# Patient Record
Sex: Male | Born: 1950 | Race: White | Hispanic: No | Marital: Married | Smoking: Former smoker
Health system: Southern US, Community
[De-identification: ages and names within clinical notes are randomized; demographics above are authoritative.]

## PROBLEM LIST (undated history)

## (undated) DIAGNOSIS — Z923 Personal history of irradiation: Secondary | ICD-10-CM

## (undated) DIAGNOSIS — C801 Malignant (primary) neoplasm, unspecified: Secondary | ICD-10-CM

## (undated) DIAGNOSIS — T7840XA Allergy, unspecified, initial encounter: Secondary | ICD-10-CM

## (undated) DIAGNOSIS — Z9221 Personal history of antineoplastic chemotherapy: Secondary | ICD-10-CM

## (undated) DIAGNOSIS — I10 Essential (primary) hypertension: Secondary | ICD-10-CM

## (undated) DIAGNOSIS — R42 Dizziness and giddiness: Secondary | ICD-10-CM

## (undated) DIAGNOSIS — E7800 Pure hypercholesterolemia, unspecified: Secondary | ICD-10-CM

## (undated) HISTORY — DX: Malignant (primary) neoplasm, unspecified: C80.1

## (undated) HISTORY — PX: CERVICAL SPINE SURGERY: SHX589

## (undated) HISTORY — DX: Personal history of irradiation: Z92.3

## (undated) HISTORY — DX: Allergy, unspecified, initial encounter: T78.40XA

## (undated) HISTORY — DX: Personal history of antineoplastic chemotherapy: Z92.21

## (undated) HISTORY — DX: Pure hypercholesterolemia, unspecified: E78.00

## (undated) HISTORY — DX: Essential (primary) hypertension: I10

## (undated) HISTORY — DX: Dizziness and giddiness: R42

---

## 1998-07-02 ENCOUNTER — Ambulatory Visit (HOSPITAL_COMMUNITY): Admission: RE | Admit: 1998-07-02 | Discharge: 1998-07-02 | Payer: Self-pay | Admitting: Gastroenterology

## 2001-02-15 ENCOUNTER — Observation Stay (HOSPITAL_COMMUNITY): Admission: RE | Admit: 2001-02-15 | Discharge: 2001-02-16 | Payer: Self-pay | Admitting: Neurosurgery

## 2003-10-03 ENCOUNTER — Encounter: Admission: RE | Admit: 2003-10-03 | Discharge: 2003-10-03 | Payer: Self-pay | Admitting: Family Medicine

## 2003-10-20 ENCOUNTER — Encounter: Admission: RE | Admit: 2003-10-20 | Discharge: 2003-11-06 | Payer: Self-pay | Admitting: Family Medicine

## 2003-11-04 ENCOUNTER — Ambulatory Visit (HOSPITAL_COMMUNITY): Admission: RE | Admit: 2003-11-04 | Discharge: 2003-11-04 | Payer: Self-pay | Admitting: Gastroenterology

## 2004-02-26 ENCOUNTER — Encounter: Admission: RE | Admit: 2004-02-26 | Discharge: 2004-02-26 | Payer: Self-pay | Admitting: Family Medicine

## 2004-03-02 ENCOUNTER — Ambulatory Visit: Payer: Self-pay | Admitting: Family Medicine

## 2004-09-07 ENCOUNTER — Ambulatory Visit: Payer: Self-pay | Admitting: Family Medicine

## 2005-03-10 ENCOUNTER — Ambulatory Visit: Payer: Self-pay | Admitting: Family Medicine

## 2005-04-05 ENCOUNTER — Ambulatory Visit: Payer: Self-pay | Admitting: Internal Medicine

## 2005-09-06 ENCOUNTER — Ambulatory Visit: Payer: Self-pay | Admitting: Family Medicine

## 2005-09-27 ENCOUNTER — Ambulatory Visit: Payer: Self-pay | Admitting: Family Medicine

## 2005-11-22 ENCOUNTER — Ambulatory Visit: Payer: Self-pay | Admitting: Family Medicine

## 2006-05-02 ENCOUNTER — Ambulatory Visit: Payer: Self-pay | Admitting: Family Medicine

## 2006-05-02 LAB — CONVERTED CEMR LAB
ALT: 36 units/L (ref 0–40)
Calcium: 9.9 mg/dL (ref 8.4–10.5)
Chloride: 106 meq/L (ref 96–112)
GFR calc Af Amer: 99 mL/min
Glucose, Bld: 96 mg/dL (ref 70–99)
Potassium: 3.9 meq/L (ref 3.5–5.1)

## 2006-05-23 ENCOUNTER — Ambulatory Visit: Payer: Self-pay | Admitting: Family Medicine

## 2006-06-06 ENCOUNTER — Encounter: Admission: RE | Admit: 2006-06-06 | Discharge: 2006-06-06 | Payer: Self-pay | Admitting: Family Medicine

## 2006-06-13 ENCOUNTER — Encounter: Admission: RE | Admit: 2006-06-13 | Discharge: 2006-06-13 | Payer: Self-pay | Admitting: Family Medicine

## 2006-07-11 DIAGNOSIS — C801 Malignant (primary) neoplasm, unspecified: Secondary | ICD-10-CM

## 2006-07-11 HISTORY — DX: Malignant (primary) neoplasm, unspecified: C80.1

## 2006-07-18 ENCOUNTER — Encounter (INDEPENDENT_AMBULATORY_CARE_PROVIDER_SITE_OTHER): Payer: Self-pay | Admitting: Otolaryngology

## 2006-07-18 ENCOUNTER — Ambulatory Visit (HOSPITAL_COMMUNITY): Admission: RE | Admit: 2006-07-18 | Discharge: 2006-07-18 | Payer: Self-pay | Admitting: Otolaryngology

## 2006-07-18 ENCOUNTER — Encounter: Admission: RE | Admit: 2006-07-18 | Discharge: 2006-07-18 | Payer: Self-pay | Admitting: Otolaryngology

## 2006-07-18 ENCOUNTER — Encounter (INDEPENDENT_AMBULATORY_CARE_PROVIDER_SITE_OTHER): Payer: Self-pay | Admitting: Specialist

## 2006-07-31 ENCOUNTER — Ambulatory Visit (HOSPITAL_COMMUNITY): Admission: RE | Admit: 2006-07-31 | Discharge: 2006-07-31 | Payer: Self-pay | Admitting: Otolaryngology

## 2006-08-08 ENCOUNTER — Ambulatory Visit: Admission: RE | Admit: 2006-08-08 | Discharge: 2006-10-27 | Payer: Self-pay | Admitting: Radiation Oncology

## 2006-08-11 ENCOUNTER — Ambulatory Visit: Payer: Self-pay | Admitting: Oncology

## 2006-08-22 ENCOUNTER — Ambulatory Visit: Payer: Self-pay | Admitting: Family Medicine

## 2006-08-22 ENCOUNTER — Encounter: Payer: Self-pay | Admitting: Family Medicine

## 2006-08-22 LAB — COMPREHENSIVE METABOLIC PANEL
AST: 24 U/L (ref 0–37)
Albumin: 4.4 g/dL (ref 3.5–5.2)
Alkaline Phosphatase: 75 U/L (ref 39–117)
Potassium: 4.3 mEq/L (ref 3.5–5.3)
Sodium: 135 mEq/L (ref 135–145)
Total Protein: 6.7 g/dL (ref 6.0–8.3)

## 2006-08-22 LAB — CBC WITH DIFFERENTIAL/PLATELET
BASO%: 0.3 % (ref 0.0–2.0)
EOS%: 7.8 % — ABNORMAL HIGH (ref 0.0–7.0)
MCH: 34 pg — ABNORMAL HIGH (ref 28.0–33.4)
MCV: 95.8 fL (ref 81.6–98.0)
MONO%: 7.8 % (ref 0.0–13.0)
NEUT#: 6.6 10*3/uL — ABNORMAL HIGH (ref 1.5–6.5)
RBC: 4.58 10*6/uL (ref 4.20–5.71)
RDW: 12.8 % (ref 11.2–14.6)

## 2006-08-24 ENCOUNTER — Ambulatory Visit: Payer: Self-pay | Admitting: Dentistry

## 2006-08-24 ENCOUNTER — Encounter: Admission: EM | Admit: 2006-08-24 | Discharge: 2006-08-24 | Payer: Self-pay | Admitting: Dentistry

## 2006-08-27 LAB — CONVERTED CEMR LAB
ALT: 37 units/L (ref 0–40)
Alkaline Phosphatase: 65 units/L (ref 39–117)
BUN: 10 mg/dL (ref 6–23)
CO2: 28 meq/L (ref 19–32)
Calcium: 9.6 mg/dL (ref 8.4–10.5)
GFR calc Af Amer: 128 mL/min
GFR calc non Af Amer: 106 mL/min

## 2006-09-08 ENCOUNTER — Encounter: Payer: Self-pay | Admitting: Family Medicine

## 2006-09-08 DIAGNOSIS — E119 Type 2 diabetes mellitus without complications: Secondary | ICD-10-CM | POA: Insufficient documentation

## 2006-09-08 DIAGNOSIS — E785 Hyperlipidemia, unspecified: Secondary | ICD-10-CM

## 2006-09-08 DIAGNOSIS — I10 Essential (primary) hypertension: Secondary | ICD-10-CM | POA: Insufficient documentation

## 2006-09-08 DIAGNOSIS — D126 Benign neoplasm of colon, unspecified: Secondary | ICD-10-CM

## 2006-09-08 DIAGNOSIS — J309 Allergic rhinitis, unspecified: Secondary | ICD-10-CM | POA: Insufficient documentation

## 2006-09-11 ENCOUNTER — Ambulatory Visit: Payer: Self-pay | Admitting: Dentistry

## 2006-09-21 ENCOUNTER — Encounter: Payer: Self-pay | Admitting: Family Medicine

## 2006-09-21 LAB — CBC WITH DIFFERENTIAL/PLATELET
BASO%: 0.7 % (ref 0.0–2.0)
EOS%: 8.2 % — ABNORMAL HIGH (ref 0.0–7.0)
HCT: 42.4 % (ref 38.7–49.9)
LYMPH%: 23.5 % (ref 14.0–48.0)
MCH: 33.7 pg — ABNORMAL HIGH (ref 28.0–33.4)
MCHC: 35.4 g/dL (ref 32.0–35.9)
MCV: 95.2 fL (ref 81.6–98.0)
NEUT%: 58.7 % (ref 40.0–75.0)
Platelets: 274 10*3/uL (ref 145–400)

## 2006-09-21 LAB — COMPREHENSIVE METABOLIC PANEL
ALT: 40 U/L (ref 0–53)
CO2: 25 mEq/L (ref 19–32)
Creatinine, Ser: 0.8 mg/dL (ref 0.40–1.50)
Total Bilirubin: 0.7 mg/dL (ref 0.3–1.2)

## 2006-09-27 ENCOUNTER — Encounter: Payer: Self-pay | Admitting: Family Medicine

## 2006-09-27 DIAGNOSIS — D0000 Carcinoma in situ of oral cavity, unspecified site: Secondary | ICD-10-CM

## 2006-09-27 DIAGNOSIS — D0008 Carcinoma in situ of pharynx: Secondary | ICD-10-CM

## 2006-09-27 DIAGNOSIS — D0001 Carcinoma in situ of labial mucosa and vermilion border: Secondary | ICD-10-CM

## 2006-09-28 ENCOUNTER — Encounter: Payer: Self-pay | Admitting: Family Medicine

## 2006-09-28 ENCOUNTER — Ambulatory Visit: Payer: Self-pay | Admitting: Oncology

## 2006-10-05 ENCOUNTER — Encounter: Payer: Self-pay | Admitting: Internal Medicine

## 2006-10-05 LAB — CBC WITH DIFFERENTIAL/PLATELET
BASO%: 0.7 % (ref 0.0–2.0)
Basophils Absolute: 0.1 10*3/uL (ref 0.0–0.1)
Eosinophils Absolute: 0.7 10*3/uL — ABNORMAL HIGH (ref 0.0–0.5)
HCT: UNDETERMINED % (ref 38.7–49.9)
LYMPH%: 6.7 % — ABNORMAL LOW (ref 14.0–48.0)
MCHC: UNDETERMINED g/dL (ref 32.0–35.9)
MONO#: 1.5 10*3/uL — ABNORMAL HIGH (ref 0.1–0.9)
NEUT%: 76.3 % — ABNORMAL HIGH (ref 40.0–75.0)
Platelets: 262 10*3/uL (ref 145–400)
WBC: 13.4 10*3/uL — ABNORMAL HIGH (ref 4.0–10.0)

## 2006-10-05 LAB — COMPREHENSIVE METABOLIC PANEL
AST: 24 U/L (ref 0–37)
Alkaline Phosphatase: 70 U/L (ref 39–117)
BUN: 10 mg/dL (ref 6–23)
Creatinine, Ser: 0.71 mg/dL (ref 0.40–1.50)
Potassium: 4.2 mEq/L (ref 3.5–5.3)
Total Bilirubin: 0.9 mg/dL (ref 0.3–1.2)

## 2006-10-12 ENCOUNTER — Encounter: Payer: Self-pay | Admitting: Family Medicine

## 2006-10-12 LAB — CBC WITH DIFFERENTIAL/PLATELET
Basophils Absolute: 0 10*3/uL (ref 0.0–0.1)
EOS%: 3 % (ref 0.0–7.0)
Eosinophils Absolute: 0.3 10*3/uL (ref 0.0–0.5)
HCT: 42.2 % (ref 38.7–49.9)
HGB: 15.4 g/dL (ref 13.0–17.1)
MCH: 34.4 pg — ABNORMAL HIGH (ref 28.0–33.4)
MCV: 94.3 fL (ref 81.6–98.0)
MONO%: 10.4 % (ref 0.0–13.0)
NEUT#: 9 10*3/uL — ABNORMAL HIGH (ref 1.5–6.5)
NEUT%: 82.5 % — ABNORMAL HIGH (ref 40.0–75.0)
Platelets: 269 10*3/uL (ref 145–400)

## 2006-10-12 LAB — COMPREHENSIVE METABOLIC PANEL
AST: 22 U/L (ref 0–37)
Albumin: 3.6 g/dL (ref 3.5–5.2)
Alkaline Phosphatase: 78 U/L (ref 39–117)
BUN: 15 mg/dL (ref 6–23)
Calcium: 9.3 mg/dL (ref 8.4–10.5)
Chloride: 99 mEq/L (ref 96–112)
Creatinine, Ser: 0.86 mg/dL (ref 0.40–1.50)
Glucose, Bld: 117 mg/dL — ABNORMAL HIGH (ref 70–99)

## 2006-10-18 ENCOUNTER — Ambulatory Visit (HOSPITAL_COMMUNITY): Admission: RE | Admit: 2006-10-18 | Discharge: 2006-10-18 | Payer: Self-pay | Admitting: Radiation Oncology

## 2006-10-19 LAB — COMPREHENSIVE METABOLIC PANEL
ALT: 28 U/L (ref 0–53)
AST: 25 U/L (ref 0–37)
Albumin: 3.7 g/dL (ref 3.5–5.2)
CO2: 26 mEq/L (ref 19–32)
Calcium: 9.4 mg/dL (ref 8.4–10.5)
Chloride: 98 mEq/L (ref 96–112)
Creatinine, Ser: 0.97 mg/dL (ref 0.40–1.50)
Potassium: 4.2 mEq/L (ref 3.5–5.3)

## 2006-10-19 LAB — CBC WITH DIFFERENTIAL/PLATELET
BASO%: 0.8 % (ref 0.0–2.0)
EOS%: 1.2 % (ref 0.0–7.0)
HCT: UNDETERMINED % (ref 38.7–49.9)
MCH: UNDETERMINED pg (ref 28.0–33.4)
MCHC: UNDETERMINED g/dL (ref 32.0–35.9)
MONO#: 1.3 10*3/uL — ABNORMAL HIGH (ref 0.1–0.9)
RBC: UNDETERMINED 10*6/uL (ref 4.20–5.71)
RDW: 10.4 % — ABNORMAL LOW (ref 11.2–14.6)
WBC: 11.9 10*3/uL — ABNORMAL HIGH (ref 4.0–10.0)
lymph#: 0.4 10*3/uL — ABNORMAL LOW (ref 0.9–3.3)

## 2006-10-26 ENCOUNTER — Encounter: Payer: Self-pay | Admitting: Family Medicine

## 2006-10-26 LAB — CBC WITH DIFFERENTIAL/PLATELET
Eosinophils Absolute: 0.1 10*3/uL (ref 0.0–0.5)
HCT: 34.8 % — ABNORMAL LOW (ref 38.7–49.9)
LYMPH%: 3 % — ABNORMAL LOW (ref 14.0–48.0)
MONO#: 0.6 10*3/uL (ref 0.1–0.9)
NEUT#: 5.3 10*3/uL (ref 1.5–6.5)
Platelets: 184 10*3/uL (ref 145–400)
RBC: 3.69 10*6/uL — ABNORMAL LOW (ref 4.20–5.71)
WBC: 6.5 10*3/uL (ref 4.0–10.0)

## 2006-10-26 LAB — COMPREHENSIVE METABOLIC PANEL
Albumin: 3.4 g/dL — ABNORMAL LOW (ref 3.5–5.2)
CO2: 29 mEq/L (ref 19–32)
Glucose, Bld: 166 mg/dL — ABNORMAL HIGH (ref 70–99)
Sodium: 132 mEq/L — ABNORMAL LOW (ref 135–145)
Total Bilirubin: 0.8 mg/dL (ref 0.3–1.2)
Total Protein: 6.6 g/dL (ref 6.0–8.3)

## 2006-10-27 ENCOUNTER — Ambulatory Visit: Admission: RE | Admit: 2006-10-27 | Discharge: 2006-11-20 | Payer: Self-pay | Admitting: Radiation Oncology

## 2006-11-02 LAB — COMPREHENSIVE METABOLIC PANEL
ALT: 20 U/L (ref 0–53)
AST: 21 U/L (ref 0–37)
Albumin: 3.4 g/dL — ABNORMAL LOW (ref 3.5–5.2)
Alkaline Phosphatase: 65 U/L (ref 39–117)
Glucose, Bld: 138 mg/dL — ABNORMAL HIGH (ref 70–99)
Potassium: 4.2 mEq/L (ref 3.5–5.3)
Sodium: 132 mEq/L — ABNORMAL LOW (ref 135–145)
Total Protein: 6.7 g/dL (ref 6.0–8.3)

## 2006-11-02 LAB — CBC WITH DIFFERENTIAL/PLATELET
EOS%: 1.8 % (ref 0.0–7.0)
MCH: UNDETERMINED pg (ref 28.0–33.4)
MCHC: UNDETERMINED g/dL (ref 32.0–35.9)
MCV: UNDETERMINED fL (ref 81.6–98.0)
MONO%: 11.2 % (ref 0.0–13.0)
RBC: UNDETERMINED 10*6/uL (ref 4.20–5.71)
RDW: UNDETERMINED % (ref 11.2–14.6)

## 2006-11-08 ENCOUNTER — Encounter: Payer: Self-pay | Admitting: Family Medicine

## 2006-11-14 ENCOUNTER — Ambulatory Visit: Payer: Self-pay | Admitting: Oncology

## 2006-11-16 ENCOUNTER — Encounter: Payer: Self-pay | Admitting: Family Medicine

## 2006-11-16 LAB — CBC WITH DIFFERENTIAL/PLATELET
BASO%: 0.6 % (ref 0.0–2.0)
Eosinophils Absolute: 0 10*3/uL (ref 0.0–0.5)
MCHC: 36 g/dL — ABNORMAL HIGH (ref 32.0–35.9)
MCV: 97.9 fL (ref 81.6–98.0)
MONO#: 0.6 10*3/uL (ref 0.1–0.9)
MONO%: 24.2 % — ABNORMAL HIGH (ref 0.0–13.0)
NEUT#: 1.8 10*3/uL (ref 1.5–6.5)
RBC: 3.12 10*6/uL — ABNORMAL LOW (ref 4.20–5.71)
RDW: 16.5 % — ABNORMAL HIGH (ref 11.2–14.6)
WBC: 2.6 10*3/uL — ABNORMAL LOW (ref 4.0–10.0)

## 2006-11-16 LAB — COMPREHENSIVE METABOLIC PANEL
ALT: 13 U/L (ref 0–53)
Albumin: 3.3 g/dL — ABNORMAL LOW (ref 3.5–5.2)
Alkaline Phosphatase: 74 U/L (ref 39–117)
Glucose, Bld: 167 mg/dL — ABNORMAL HIGH (ref 70–99)
Potassium: 4.5 mEq/L (ref 3.5–5.3)
Sodium: 135 mEq/L (ref 135–145)
Total Protein: 6.2 g/dL (ref 6.0–8.3)

## 2006-12-26 ENCOUNTER — Ambulatory Visit: Payer: Self-pay | Admitting: Dentistry

## 2007-01-02 ENCOUNTER — Encounter: Payer: Self-pay | Admitting: Family Medicine

## 2007-01-02 ENCOUNTER — Ambulatory Visit: Payer: Self-pay | Admitting: Oncology

## 2007-01-02 LAB — CBC WITH DIFFERENTIAL/PLATELET
BASO%: 0.4 % (ref 0.0–2.0)
Eosinophils Absolute: 0.6 10*3/uL — ABNORMAL HIGH (ref 0.0–0.5)
HCT: 35.5 % — ABNORMAL LOW (ref 38.7–49.9)
LYMPH%: 10.1 % — ABNORMAL LOW (ref 14.0–48.0)
MCHC: 36.5 g/dL — ABNORMAL HIGH (ref 32.0–35.9)
MONO#: 0.6 10*3/uL (ref 0.1–0.9)
NEUT#: 3.9 10*3/uL (ref 1.5–6.5)
NEUT%: 67.8 % (ref 40.0–75.0)
Platelets: 217 10*3/uL (ref 145–400)
WBC: 5.8 10*3/uL (ref 4.0–10.0)
lymph#: 0.6 10*3/uL — ABNORMAL LOW (ref 0.9–3.3)

## 2007-01-02 LAB — COMPREHENSIVE METABOLIC PANEL
ALT: 11 U/L (ref 0–53)
CO2: 25 mEq/L (ref 19–32)
Calcium: 9.6 mg/dL (ref 8.4–10.5)
Chloride: 105 mEq/L (ref 96–112)
Creatinine, Ser: 0.75 mg/dL (ref 0.40–1.50)
Glucose, Bld: 117 mg/dL — ABNORMAL HIGH (ref 70–99)
Sodium: 140 mEq/L (ref 135–145)
Total Bilirubin: 0.4 mg/dL (ref 0.3–1.2)
Total Protein: 6.7 g/dL (ref 6.0–8.3)

## 2007-01-03 ENCOUNTER — Ambulatory Visit (HOSPITAL_COMMUNITY): Admission: RE | Admit: 2007-01-03 | Discharge: 2007-01-03 | Payer: Self-pay | Admitting: Radiation Oncology

## 2007-01-09 ENCOUNTER — Ambulatory Visit: Admission: RE | Admit: 2007-01-09 | Discharge: 2007-02-28 | Payer: Self-pay | Admitting: Radiation Oncology

## 2007-02-07 ENCOUNTER — Encounter: Payer: Self-pay | Admitting: Family Medicine

## 2007-02-07 LAB — CBC WITH DIFFERENTIAL/PLATELET
Basophils Absolute: 0.1 10*3/uL (ref 0.0–0.1)
EOS%: 6.1 % (ref 0.0–7.0)
LYMPH%: 9.6 % — ABNORMAL LOW (ref 14.0–48.0)
MCHC: 36.6 g/dL — ABNORMAL HIGH (ref 32.0–35.9)
MONO%: 12 % (ref 0.0–13.0)
NEUT#: 4.3 10*3/uL (ref 1.5–6.5)
NEUT%: 71.2 % (ref 40.0–75.0)
RBC: 4.15 10*6/uL — ABNORMAL LOW (ref 4.20–5.71)
WBC: 6 10*3/uL (ref 4.0–10.0)
lymph#: 0.6 10*3/uL — ABNORMAL LOW (ref 0.9–3.3)

## 2007-02-07 LAB — COMPREHENSIVE METABOLIC PANEL
AST: 13 U/L (ref 0–37)
Alkaline Phosphatase: 56 U/L (ref 39–117)
BUN: 15 mg/dL (ref 6–23)
Creatinine, Ser: 0.81 mg/dL (ref 0.40–1.50)
Total Bilirubin: 0.6 mg/dL (ref 0.3–1.2)

## 2007-02-12 ENCOUNTER — Ambulatory Visit (HOSPITAL_COMMUNITY): Admission: RE | Admit: 2007-02-12 | Discharge: 2007-02-12 | Payer: Self-pay | Admitting: Oncology

## 2007-02-22 ENCOUNTER — Ambulatory Visit: Payer: Self-pay | Admitting: Oncology

## 2007-02-27 ENCOUNTER — Encounter: Payer: Self-pay | Admitting: Family Medicine

## 2007-03-06 ENCOUNTER — Ambulatory Visit: Payer: Self-pay | Admitting: Family Medicine

## 2007-03-09 LAB — CONVERTED CEMR LAB
ALT: 16 units/L (ref 0–53)
Albumin: 4 g/dL (ref 3.5–5.2)
Alkaline Phosphatase: 61 units/L (ref 39–117)
BUN: 11 mg/dL (ref 6–23)
Basophils Relative: 0.7 % (ref 0.0–1.0)
CO2: 29 meq/L (ref 19–32)
Calcium: 10 mg/dL (ref 8.4–10.5)
Creatinine, Ser: 0.8 mg/dL (ref 0.4–1.5)
GFR calc Af Amer: 128 mL/min
HDL: 36.2 mg/dL — ABNORMAL LOW (ref >39.0)
LDL Cholesterol: 71 mg/dL (ref 0–99)
Microalb Creat Ratio: 4.3 mg/g (ref 0.0–30.0)
Monocytes Relative: 9.8 % (ref 3.0–11.0)
Neutro Abs: 5.1 10*3/uL (ref 1.4–7.7)
Platelets: 255 10*3/uL (ref 150–400)
RDW: 11.8 % (ref 11.5–14.6)
TSH: 1.35 microintl units/mL (ref 0.35–5.50)
Total Protein: 6.9 g/dL (ref 6.0–8.3)
Triglycerides: 158 mg/dL — ABNORMAL HIGH (ref 0–149)
VLDL: 32 mg/dL (ref 0–40)

## 2007-03-14 ENCOUNTER — Encounter (INDEPENDENT_AMBULATORY_CARE_PROVIDER_SITE_OTHER): Payer: Self-pay | Admitting: *Deleted

## 2007-03-15 ENCOUNTER — Encounter: Payer: Self-pay | Admitting: Family Medicine

## 2007-06-22 ENCOUNTER — Ambulatory Visit: Payer: Self-pay | Admitting: Oncology

## 2007-06-26 ENCOUNTER — Encounter: Payer: Self-pay | Admitting: Family Medicine

## 2007-06-26 LAB — COMPREHENSIVE METABOLIC PANEL
AST: 20 U/L (ref 0–37)
Albumin: 4.5 g/dL (ref 3.5–5.2)
Alkaline Phosphatase: 52 U/L (ref 39–117)
Glucose, Bld: 108 mg/dL — ABNORMAL HIGH (ref 70–99)
Potassium: 4.5 mEq/L (ref 3.5–5.3)
Sodium: 138 mEq/L (ref 135–145)
Total Protein: 7.2 g/dL (ref 6.0–8.3)

## 2007-06-26 LAB — CBC WITH DIFFERENTIAL/PLATELET
Eosinophils Absolute: 0.7 10*3/uL — ABNORMAL HIGH (ref 0.0–0.5)
MCV: 95.2 fL (ref 81.6–98.0)
MONO%: 12.4 % (ref 0.0–13.0)
NEUT#: 4 10*3/uL (ref 1.5–6.5)
RBC: 4.52 10*6/uL (ref 4.20–5.71)
RDW: 13.1 % (ref 11.2–14.6)
WBC: 6.4 10*3/uL (ref 4.0–10.0)

## 2007-07-31 ENCOUNTER — Encounter: Payer: Self-pay | Admitting: Family Medicine

## 2007-08-02 ENCOUNTER — Ambulatory Visit (HOSPITAL_COMMUNITY): Admission: RE | Admit: 2007-08-02 | Discharge: 2007-08-02 | Payer: Self-pay | Admitting: Oncology

## 2007-10-02 ENCOUNTER — Ambulatory Visit: Payer: Self-pay | Admitting: Oncology

## 2007-10-04 ENCOUNTER — Encounter: Payer: Self-pay | Admitting: Family Medicine

## 2007-10-04 LAB — COMPREHENSIVE METABOLIC PANEL
Albumin: 4.3 g/dL (ref 3.5–5.2)
CO2: 26 mEq/L (ref 19–32)
Calcium: 9.4 mg/dL (ref 8.4–10.5)
Chloride: 105 mEq/L (ref 96–112)
Glucose, Bld: 118 mg/dL — ABNORMAL HIGH (ref 70–99)
Potassium: 4.8 mEq/L (ref 3.5–5.3)
Sodium: 140 mEq/L (ref 135–145)
Total Protein: 6.6 g/dL (ref 6.0–8.3)

## 2007-10-04 LAB — CBC WITH DIFFERENTIAL/PLATELET
Eosinophils Absolute: 0.5 10*3/uL (ref 0.0–0.5)
HGB: 14.6 g/dL (ref 13.0–17.1)
MONO#: 0.7 10*3/uL (ref 0.1–0.9)
NEUT#: 3.7 10*3/uL (ref 1.5–6.5)
RBC: 4.33 10*6/uL (ref 4.20–5.71)
RDW: 12.7 % (ref 11.2–14.6)
WBC: 5.9 10*3/uL (ref 4.0–10.0)

## 2007-12-11 ENCOUNTER — Encounter: Admission: RE | Admit: 2007-12-11 | Discharge: 2008-01-31 | Payer: Self-pay | Admitting: Unknown Physician Specialty

## 2008-01-23 ENCOUNTER — Encounter: Payer: Self-pay | Admitting: Family Medicine

## 2008-02-12 ENCOUNTER — Ambulatory Visit: Payer: Self-pay | Admitting: Oncology

## 2008-02-14 LAB — COMPREHENSIVE METABOLIC PANEL
CO2: 27 mEq/L (ref 19–32)
Glucose, Bld: 125 mg/dL — ABNORMAL HIGH (ref 70–99)
Sodium: 141 mEq/L (ref 135–145)
Total Bilirubin: 0.6 mg/dL (ref 0.3–1.2)
Total Protein: 6.9 g/dL (ref 6.0–8.3)

## 2008-02-14 LAB — CBC WITH DIFFERENTIAL/PLATELET
Eosinophils Absolute: 0.5 10*3/uL (ref 0.0–0.5)
HCT: 42.8 % (ref 38.7–49.9)
HGB: 15.1 g/dL (ref 13.0–17.1)
LYMPH%: 13.7 % — ABNORMAL LOW (ref 14.0–48.0)
MONO#: 0.5 10*3/uL (ref 0.1–0.9)
NEUT#: 4.1 10*3/uL (ref 1.5–6.5)
NEUT%: 68.6 % (ref 40.0–75.0)
Platelets: 219 10*3/uL (ref 145–400)
RBC: 4.44 10*6/uL (ref 4.20–5.71)
WBC: 6 10*3/uL (ref 4.0–10.0)

## 2008-02-15 ENCOUNTER — Ambulatory Visit (HOSPITAL_COMMUNITY): Admission: RE | Admit: 2008-02-15 | Discharge: 2008-02-15 | Payer: Self-pay | Admitting: Oncology

## 2008-02-21 ENCOUNTER — Encounter: Payer: Self-pay | Admitting: Family Medicine

## 2008-09-16 ENCOUNTER — Encounter: Payer: Self-pay | Admitting: Family Medicine

## 2008-09-16 ENCOUNTER — Ambulatory Visit: Payer: Self-pay | Admitting: Oncology

## 2008-09-18 ENCOUNTER — Encounter: Payer: Self-pay | Admitting: Family Medicine

## 2008-09-18 LAB — COMPREHENSIVE METABOLIC PANEL
BUN: 14 mg/dL (ref 6–23)
CO2: 26 mEq/L (ref 19–32)
Creatinine, Ser: 0.96 mg/dL (ref 0.40–1.50)
Glucose, Bld: 112 mg/dL — ABNORMAL HIGH (ref 70–99)
Total Bilirubin: 0.5 mg/dL (ref 0.3–1.2)

## 2008-09-18 LAB — CBC WITH DIFFERENTIAL/PLATELET
Eosinophils Absolute: 0.5 10*3/uL (ref 0.0–0.5)
HCT: 41.5 % (ref 38.4–49.9)
LYMPH%: 13.7 % — ABNORMAL LOW (ref 14.0–49.0)
MCV: 95.4 fL (ref 79.3–98.0)
MONO#: 0.6 10*3/uL (ref 0.1–0.9)
NEUT#: 4.3 10*3/uL (ref 1.5–6.5)
NEUT%: 69 % (ref 39.0–75.0)
Platelets: 220 10*3/uL (ref 140–400)
WBC: 6.3 10*3/uL (ref 4.0–10.3)

## 2009-01-09 IMAGING — CT CT NECK W/ CM
3 of 4 series · 15 of 33 positions shown, 18 images · IV contrast ([ID] OMNI 300)
Comparison: L4-9QD Thyroid Ultrasound, 06/06/06.

CLINICAL DATA: Palpable right neck mass with prior L4-9QD thyroid ultrasound, 06/06/06 demonstrating irregular masses separate from the thyroid gland suspicious for cervical adenopathy. 
CT NECK WITH CONTRAST:
TECHNIQUE: Multidetector CT imaging of the neck was performed following the standard protocol during administration of intravenous contrast.
Contrast:  100 cc Omnipaque 300

[Series 2: neck w/ · axial · 0.39mm/px · z∈[-66,+163]mm · 7 of 77 slices shown, 9 images]
[im 8/77  soft-tissue]
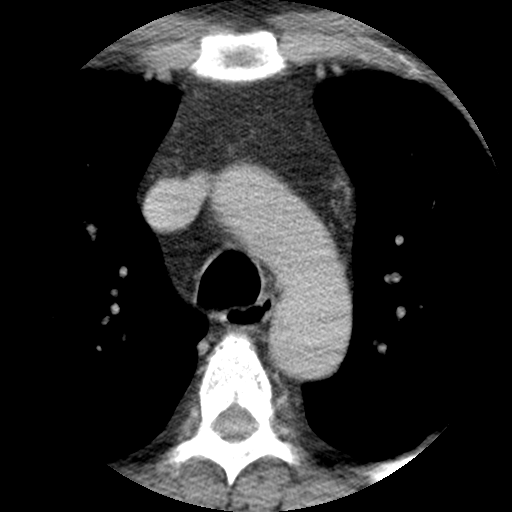
[im 8/77  bone]
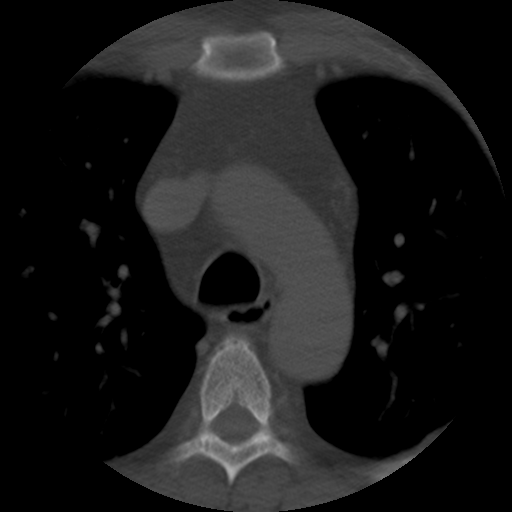
[im 16/77  bone]
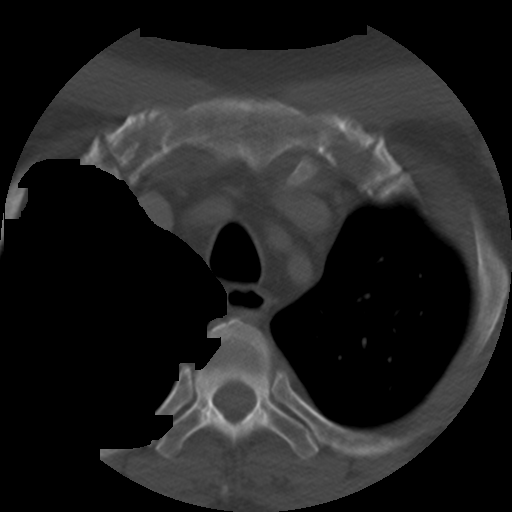
[im 31/77  bone]
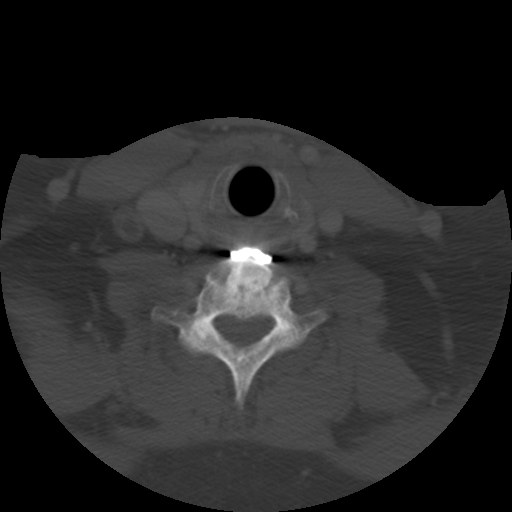
[im 39/77  bone]
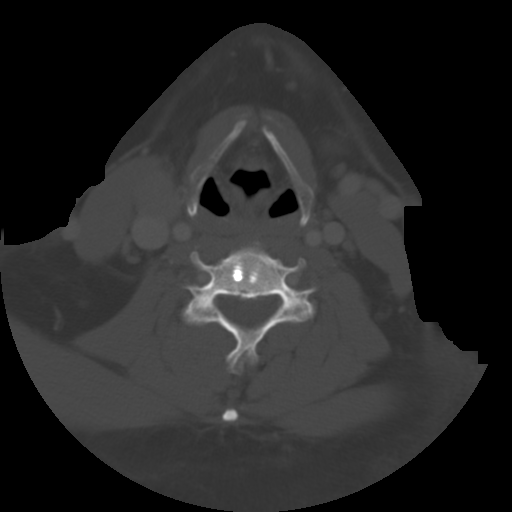
[im 46/77  soft-tissue]
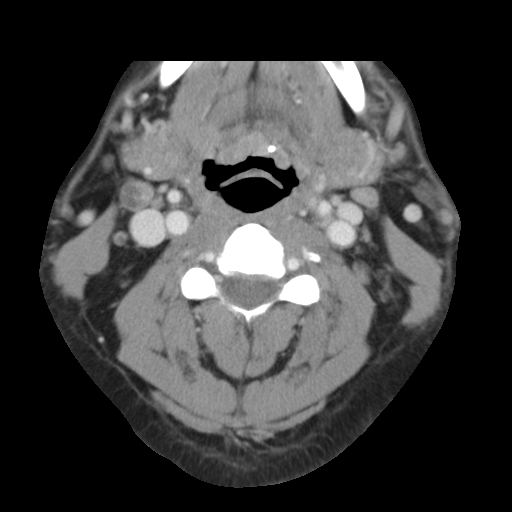
[im 46/77  bone]
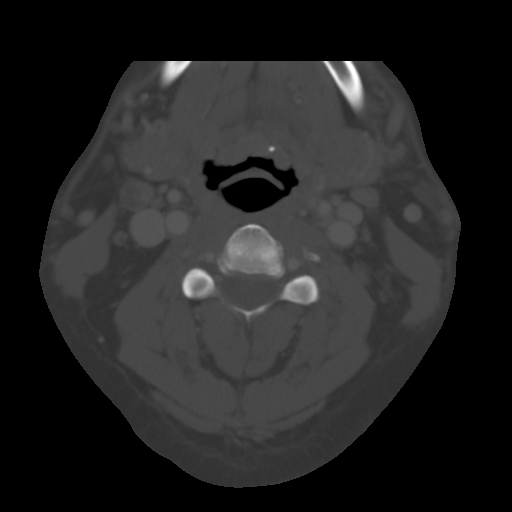
[im 61/77  bone]
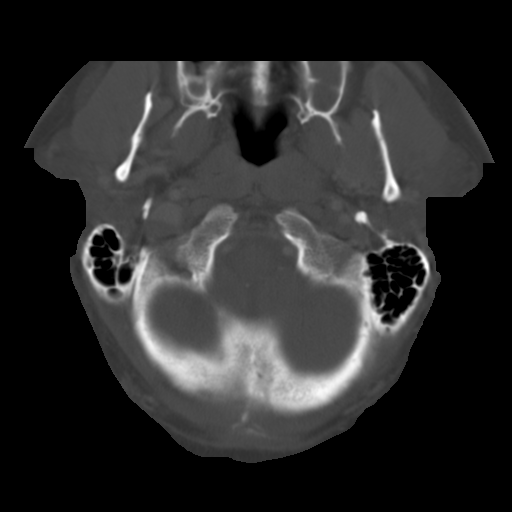
[im 69/77  bone]
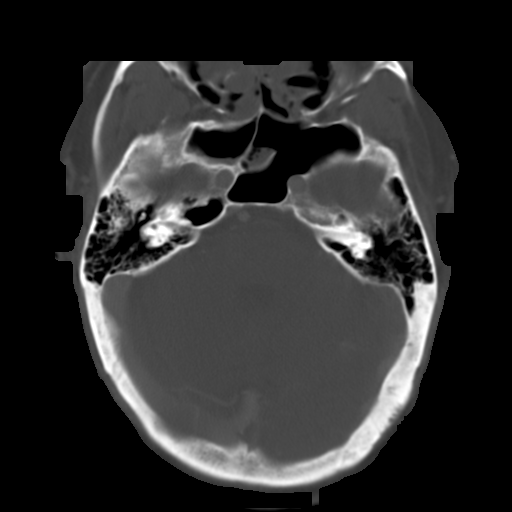

[Series 400: sagittal · sagittal · 0.57mm/px · 5 of 80 slices shown, 6 images]
[im 27/80  bone]
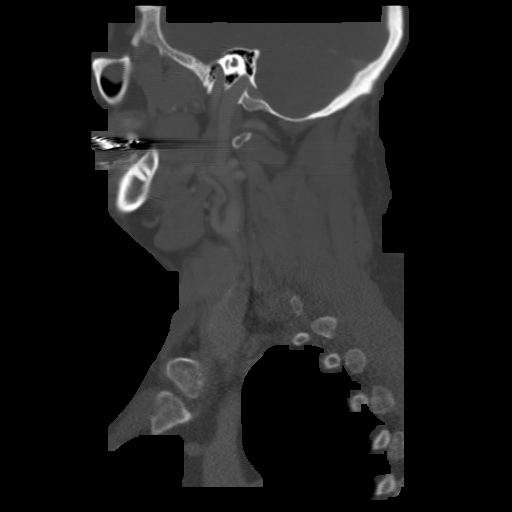
[im 33/80  bone]
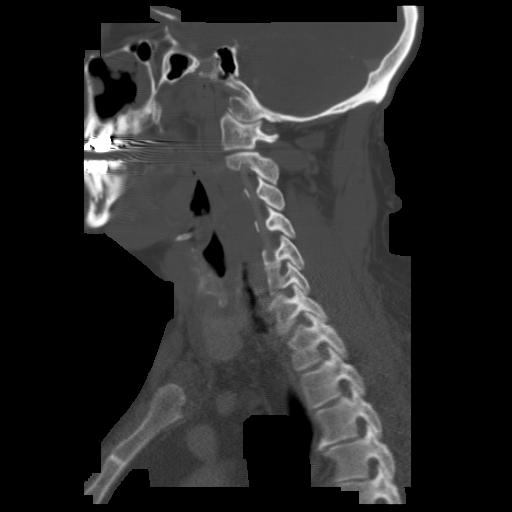
[im 40/80  soft-tissue]
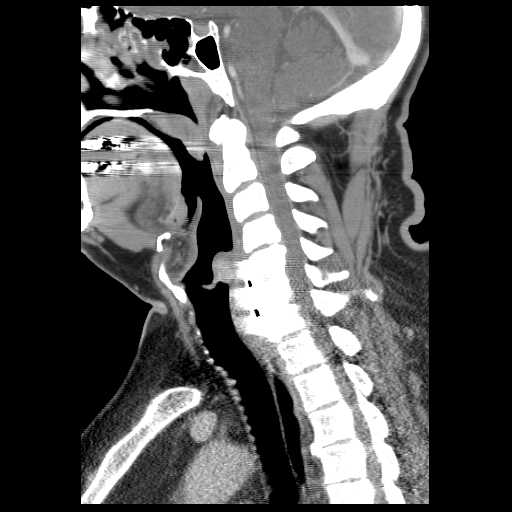
[im 40/80  bone]
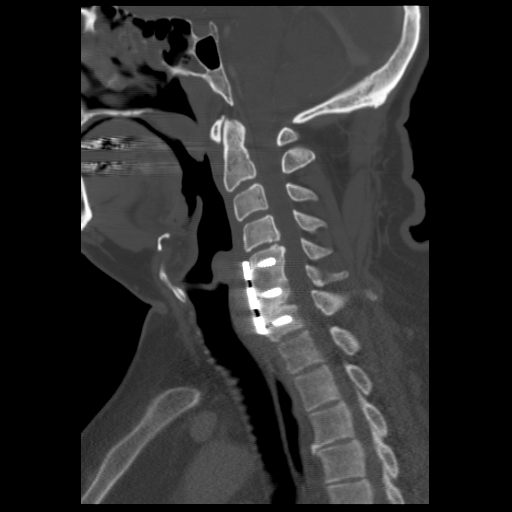
[im 47/80  bone]
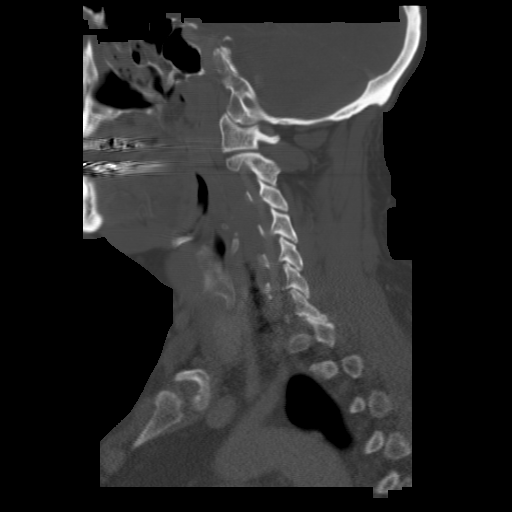
[im 53/80  bone]
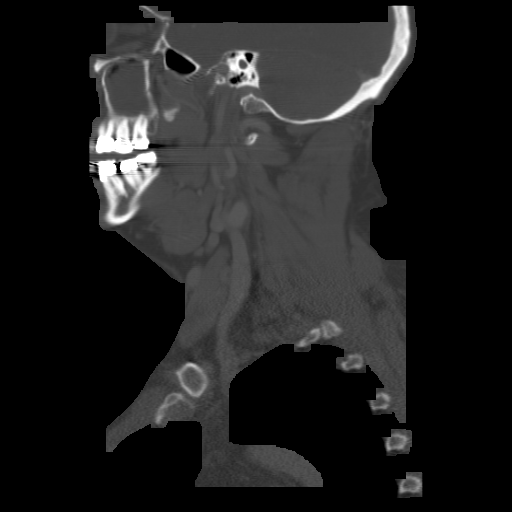

[Series 401: coronal · coronal · 0.57mm/px · 3 of 71 slices shown]
[im 15/71  bone]
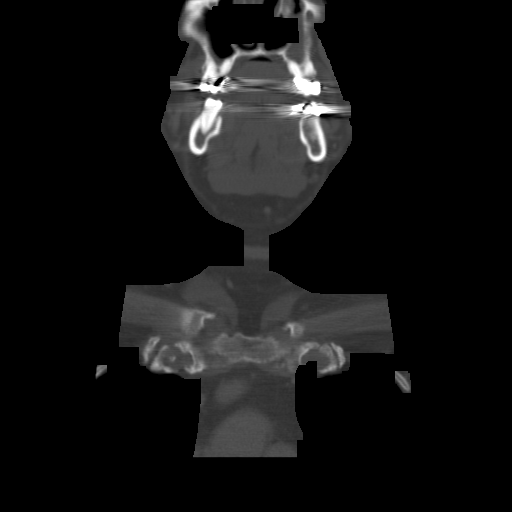
[im 29/71  bone]
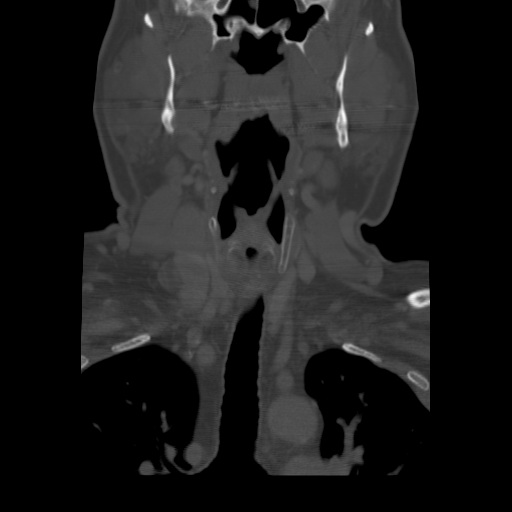
[im 43/71  bone]
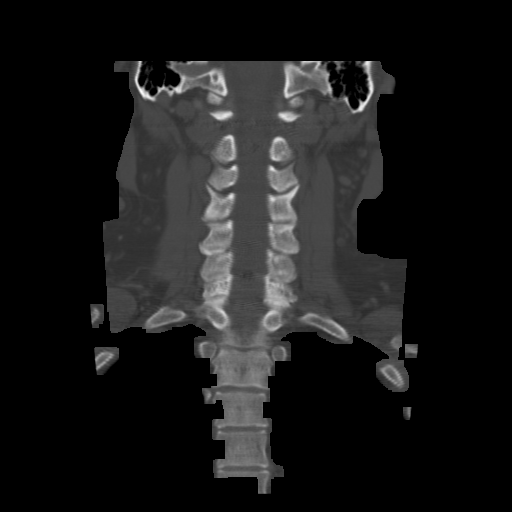

[15 of 33 positions shown; findings below may reference images not displayed]

FINDINGS: Region of maximal symptoms as indicated by patient is marked with surface BB (image 40) with subjacent largest cervical adenopathy measuring 3.6 cm long (sagittal image 27) x 2.5 cm AP x 2 cm wide (axial image 41) at the right level III/iinternal jugular chain.  This corresponds to largest heterogeneous lymph node seen on previous ultrasound.  Smaller multiple right level II/jugulodigastric (2.5 cm long - sagittal image 24 x 1.3 cm AP x 1.4 cm wide - image 31) and right inferior level V/posterior triangle adenopathy (1.9 cm long - sagittal image 22 x 1.4 cm AP x 1.1 cm wide - axial image 48).  The latter right level II and level V lymph nodes demonstrate central low density consistent with central necrosis.  Adenopathy does not demonstrate significant contrast enhancement.  No significant left cervical nor mediastinal adenopathy/masses are seen.  Visualized portion of brain, salivary glands, orbits, naso-oro-hypopharynx, larynx, thyroid gland, and lung apices appear normal.  Findings consistent with probable chronic pansinusitis is most marked at the bilateral ethmoid and maxillary sinuses.  Anterior cervical diskectomy fusion hardware and post operative changes are seen C5 through C7.
IMPRESSION: 1.  No interval change in right cervical adenopathy most marked at right III with centrally necrotic smaller adenopathy at right level II and V.  Differential diagnosis includes lymphoma, nonspecific metastatic cancer, and lymphadenitis to include tuberculosis.  Recommend tissue diagnosis.
2.  Post C5 through C7 cervical diskectomy/fusion changes. 
3.  Chronic appearing pansinusitis most marked at the left maxillary and right greater than left ethmoid sinuses. 
4.  Otherwise no significant abnormality.

## 2009-03-18 ENCOUNTER — Ambulatory Visit: Payer: Self-pay | Admitting: Oncology

## 2009-03-20 ENCOUNTER — Encounter: Payer: Self-pay | Admitting: Family Medicine

## 2009-03-20 ENCOUNTER — Ambulatory Visit (HOSPITAL_COMMUNITY): Admission: RE | Admit: 2009-03-20 | Discharge: 2009-03-20 | Payer: Self-pay | Admitting: Oncology

## 2009-03-20 LAB — CBC WITH DIFFERENTIAL/PLATELET
EOS%: 6.8 % (ref 0.0–7.0)
Eosinophils Absolute: 0.5 10*3/uL (ref 0.0–0.5)
LYMPH%: 14.1 % (ref 14.0–49.0)
MCH: 34 pg — ABNORMAL HIGH (ref 27.2–33.4)
MCHC: 34.3 g/dL (ref 32.0–36.0)
MCV: 98.9 fL — ABNORMAL HIGH (ref 79.3–98.0)
MONO%: 9 % (ref 0.0–14.0)
NEUT#: 4.9 10*3/uL (ref 1.5–6.5)
Platelets: 241 10*3/uL (ref 140–400)
RBC: 4.34 10*6/uL (ref 4.20–5.82)

## 2009-03-20 LAB — COMPREHENSIVE METABOLIC PANEL
AST: 17 U/L (ref 0–37)
Alkaline Phosphatase: 50 U/L (ref 39–117)
BUN: 15 mg/dL (ref 6–23)
Glucose, Bld: 101 mg/dL — ABNORMAL HIGH (ref 70–99)
Sodium: 141 mEq/L (ref 135–145)
Total Bilirubin: 0.5 mg/dL (ref 0.3–1.2)
Total Protein: 7.2 g/dL (ref 6.0–8.3)

## 2009-03-24 ENCOUNTER — Encounter: Payer: Self-pay | Admitting: Family Medicine

## 2009-05-15 ENCOUNTER — Encounter: Payer: Self-pay | Admitting: Family Medicine

## 2009-07-28 ENCOUNTER — Encounter: Payer: Self-pay | Admitting: Family Medicine

## 2009-09-15 ENCOUNTER — Ambulatory Visit: Payer: Self-pay | Admitting: Oncology

## 2009-09-17 ENCOUNTER — Encounter: Payer: Self-pay | Admitting: Family Medicine

## 2009-09-17 LAB — COMPREHENSIVE METABOLIC PANEL
ALT: 21 U/L (ref 0–53)
Albumin: 4.6 g/dL (ref 3.5–5.2)
CO2: 26 mEq/L (ref 19–32)
Calcium: 9.6 mg/dL (ref 8.4–10.5)
Chloride: 104 mEq/L (ref 96–112)
Glucose, Bld: 113 mg/dL — ABNORMAL HIGH (ref 70–99)
Potassium: 4.1 mEq/L (ref 3.5–5.3)
Sodium: 138 mEq/L (ref 135–145)
Total Protein: 6.9 g/dL (ref 6.0–8.3)

## 2009-09-17 LAB — CBC WITH DIFFERENTIAL/PLATELET
BASO%: 0.4 % (ref 0.0–2.0)
Eosinophils Absolute: 0.7 10*3/uL — ABNORMAL HIGH (ref 0.0–0.5)
LYMPH%: 18 % (ref 14.0–49.0)
MONO#: 0.7 10*3/uL (ref 0.1–0.9)
NEUT#: 4.1 10*3/uL (ref 1.5–6.5)
Platelets: 222 10*3/uL (ref 140–400)
RBC: 4.28 10*6/uL (ref 4.20–5.82)
RDW: 13 % (ref 11.0–14.6)
WBC: 6.7 10*3/uL (ref 4.0–10.3)
lymph#: 1.2 10*3/uL (ref 0.9–3.3)

## 2010-01-26 ENCOUNTER — Encounter: Payer: Self-pay | Admitting: Family Medicine

## 2010-03-17 ENCOUNTER — Ambulatory Visit: Payer: Self-pay | Admitting: Oncology

## 2010-03-19 ENCOUNTER — Ambulatory Visit (HOSPITAL_COMMUNITY)
Admission: RE | Admit: 2010-03-19 | Discharge: 2010-03-19 | Payer: Self-pay | Source: Home / Self Care | Attending: Oncology | Admitting: Oncology

## 2010-03-19 ENCOUNTER — Encounter: Payer: Self-pay | Admitting: Family Medicine

## 2010-03-19 LAB — CBC WITH DIFFERENTIAL/PLATELET
BASO%: 0.7 % (ref 0.0–2.0)
Eosinophils Absolute: 0.6 10*3/uL — ABNORMAL HIGH (ref 0.0–0.5)
LYMPH%: 17 % (ref 14.0–49.0)
MCHC: 35.4 g/dL (ref 32.0–36.0)
MONO#: 0.7 10*3/uL (ref 0.1–0.9)
MONO%: 10 % (ref 0.0–14.0)
NEUT#: 4.2 10*3/uL (ref 1.5–6.5)
RBC: 4.33 10*6/uL (ref 4.20–5.82)
RDW: 13.2 % (ref 11.0–14.6)
WBC: 6.6 10*3/uL (ref 4.0–10.3)

## 2010-03-19 LAB — COMPREHENSIVE METABOLIC PANEL
ALT: 21 U/L (ref 0–53)
Albumin: 4.7 g/dL (ref 3.5–5.2)
Alkaline Phosphatase: 49 U/L (ref 39–117)
CO2: 29 mEq/L (ref 19–32)
Glucose, Bld: 103 mg/dL — ABNORMAL HIGH (ref 70–99)
Potassium: 4.4 mEq/L (ref 3.5–5.3)
Sodium: 138 mEq/L (ref 135–145)
Total Bilirubin: 0.7 mg/dL (ref 0.3–1.2)
Total Protein: 7.1 g/dL (ref 6.0–8.3)

## 2010-03-30 ENCOUNTER — Encounter: Payer: Self-pay | Admitting: Family Medicine

## 2010-05-03 ENCOUNTER — Encounter: Payer: Self-pay | Admitting: Radiation Oncology

## 2010-05-13 NOTE — Letter (Signed)
[facility]   Imported By: Lanelle Bal 08/14/2009 11:38:18  _____________________________________________________________________  External Attachment:    Type:   Image     Comment:   External Document

## 2010-05-13 NOTE — Letter (Signed)
Summary: [facility]  [facility]   Imported By: Lanelle Bal 06/02/2009 11:11:30  _____________________________________________________________________  External Attachment:    Type:   Image     Comment:   External Document

## 2010-05-13 NOTE — Letter (Signed)
Summary: [facility]  [facility]   Imported By: Lanelle Bal 04/01/2010 13:33:35  _____________________________________________________________________  External Attachment:    Type:   Image     Comment:   External Document

## 2010-05-13 NOTE — Letter (Signed)
Summary: [facility]  [facility]   Imported By: Lanelle Bal 04/23/2009 08:06:52  _____________________________________________________________________  External Attachment:    Type:   Image     Comment:   External Document

## 2010-05-13 NOTE — Letter (Signed)
[facility]   Imported By: Lanelle Bal 10/15/2009 10:43:45  _____________________________________________________________________  External Attachment:    Type:   Image     Comment:   External Document

## 2010-05-13 NOTE — Letter (Signed)
Summary: [facility]  [facility]   Imported By: Lanelle Bal 04/23/2009 08:09:04  _____________________________________________________________________  External Attachment:    Type:   Image     Comment:   External Document

## 2010-05-13 NOTE — Letter (Signed)
Summary: [facility]  [facility]   Imported By: Lanelle Bal 04/19/2010 09:57:57  _____________________________________________________________________  External Attachment:    Type:   Image     Comment:   External Document

## 2010-05-13 NOTE — Letter (Signed)
Summary: [facility]  [facility]   Imported By: Lanelle Bal 02/25/2010 12:38:16  _____________________________________________________________________  External Attachment:    Type:   Image     Comment:   External Document

## 2010-08-16 ENCOUNTER — Ambulatory Visit: Payer: BC Managed Care – PPO | Attending: Radiation Oncology | Admitting: Radiation Oncology

## 2010-08-27 NOTE — Op Note (Signed)
[place]  Patient:    Carlos Castaneda, Carlos Castaneda Visit Number: [id number] MRN: [id number]          Service Type: SUR Location: [id number] Attending Physician:  Cristi Loron Dictated by:   Cristi Loron, M.D. Proc. Date: 02/15/01 Admit Date:  02/15/2001 Discharge Date: 02/16/2001                             Operative Report  PREOPERATIVE DIAGNOSIS: C5-6 and C6-7 degenerative disk disease, spondylosis, spinal stenosis, cervical radiculopathy, and cervicalgia.  POSTOPERATIVE DIAGNOSIS: C506 and C6-7 degenerative disk disease, spondylosis, spinal stenosis, cervical radiculopathy, and cervicalgia.  OPERATION/PROCEDURE: C5-6 and C6-7 extensive anterior cervical diskectomy, interbody iliac crest allograft arthrodesis, anterior cervical plating (Codman titanium plate and screws).  SURGEON: Cristi Loron, M.D.  ASSISTANT: Tanya Nones. Jeral Fruit, M.D.  ANESTHESIA: General endotracheal.  ESTIMATED BLOOD LOSS: Two hundred cc.  SPECIMENS: None.  DRAINS: None.  COMPLICATIONS: None.  INDICATIONS FOR PROCEDURE: The patient is a 60 year old white male, who has suffered from neck and bilateral arm pain.  He failed medical management and was worked up with a cervical MRI, which demonstrates significant spondylosis and stenosis at C5-6 and C6-7.  The patient therefore weighed the risks and benefits and alternatives of surgery and decided to proceed with anterior cervical diskectomy with fusion and plating.  DESCRIPTION OF PROCEDURE: The patient was brought to the operating room by the anesthesia team and general endotracheal anesthesia was induced.  The patient remained in the supine position and a roll was placed under his shoulders to place his neck in slight extension.  His anterior cervical region was then prepared with Betadine scrub and Betadine solution and sterile drapes were applied.  I then injected the area to be incised with Marcaine  with epinephrine solution and used a scalpel to make a transverse incision in the patients left anterior neck.  I then used the Metzenbaum scissors to divide the platysma muscle and dissect medial to the sternocleidomastoid muscle, jugular vein, and carotid artery.  I bluntly dissected down toward to the anterior cervical spine, carefully identifying the esophagus and retracting it medially.  I used Kitner swabs to clear soft tissue from the anterior cervical spine and then inserted a bent spinal needle into the upper exposed interspace.  I obtained an intraoperative radiograph that demonstrated the needle was at C5-6.  I then used electrocautery to detach the medial border of the longus colli muscle bilaterally from the C5-6 and C6-7 intervertebral disk space.  I inserted the Caspar self-retaining retractor for exposure and then used a 15 blade scalpel to incise the C5-6 intervertebral disk.  I performed a partial diskectomy with pituitary forceps and Carlens curets.  I inserted distraction screws at C5-C6, distracted the interspace, and used a high speed drill to decorticate the vertebral bone plate at Y7-8 and drill away the remainder of the C5-6 intervertebral disk.  There was considerable spondylosis.  I drill the bone spurs away with the drill and thinned out the posterior longitudinal ligament with the drill.  I incised the ligament with the arachnoid knife and then removed it with the Kerrison punch, undercutting the vertebral end plates at G9-5, decompressing the thecal sac.  I performed foraminotomy about the bilateral C6 nerve root.  I then repeated the procedure at C6-7.  I removed the distraction screw from C5 and placed it into C7, distracted C6-7 interspace, incised the intervertebral  disk, and performed partial diskectomy with pituitary forceps and Carlens curets.  I then used a high speed drill to decorticate the vertebral end plates at Z6-1 and drilled away the remainder  of the intervertebral disk and some bone spurs posteriorly.  I thinned out the posterior longitudinal ligament at C6-7, incised the ligament with the arachnoid knife, and then removed it with a Kerrison punch, undercutting the vertebral end plates at W9-6 and decompressing the thecal sac.  I performed foraminotomy about the bilateral C7 nerve root.  At this point I had good decompression at C5-6 and C6-7.  I now turned my attention to arthrodesis.  I obtained iliac crest tricortical allograft bone grafts and fashioned these approximate dimensions 7 mm in height and 1 cm in depth.  I inserted one bone graft at the distraction C6-7 interspace.  I then removed distraction from C7 and placed it back at C5, distracted C5-6 interspace and put the other bone graft of similar dimensions at the C5-6 interspace.  I removed the distraction screws in order to get snug fit of bone graft at both levels.  I used the high speed drill to remove some anterior spondylosis from C5-6 and C6-7.  I now turned my attention to instrumentation.  I obtained the appropriate length Codman anterior cervical plate and laid it along the anterior aspect of the vertebral bodies from C5 to C7.  I drilled two holes at C5 and two at C6 and two at C7, tapped the holes and secured the plates at the vertebral bodies with 15 mm screws, two at each level.  I then obtained an intraoperative radiograph that demonstrated good position of the upper screws but I could not see the lower screws because of the patients large shoulders, but they looked good in vivo.  I therefore locked each screw in place using the cam tightener. I then copiously irrigated the wound with Bacitracin solution and removed the solution.  I achieved hemostasis using bipolar electrocautery and Gelfoam.  I removed the Caspar self-retaining retractor and inspected the esophagus for any damage.  There are none apparent.  I then reapproximated the  patients platysma muscle with interrupted 3-0 Vicryl sutures, subcutaneous tissue with interrupted 3-0 Vicryl suture, and the skin with Steri-Strips and benzoin.  The wound was then covered with Bacitracin ointment and a sterile dressing applied.  The drapes were removed and the patient was subsequently extubated by the anesthesia team and transported to the postanesthesia care unit in stable condition.  All sponge, needle, and instrument counts were correct at the end of this case. Dictated by:   Cristi Loron, M.D. Attending Physician:  Tressie Stalker D DD:  02/16/01 TD:  02/17/01 Job: [id number]

## 2010-08-27 NOTE — Op Note (Signed)
NAME:  Carlos Castaneda, Carlos Castaneda                           ACCOUNT NO.:  [id number]   MEDICAL RECORD NO.:  [id number]                   PATIENT TYPE:  AMB   LOCATION:  ENDO                                 FACILITY:  [facility]   PHYSICIAN:  James L. Malon Kindle., M.D.          DATE OF BIRTH:  12-13-1950   DATE OF PROCEDURE:  11/04/2003  DATE OF DISCHARGE:                                 OPERATIVE REPORT   PROCEDURE:  Colonoscopy.   PREMEDICATION:  Fentanyl 60 mcg, Versed 6 mg IV.   INDICATIONS FOR PROCEDURE:  A previous history of adenomatous polyp.  This  is done as a five-year follow-up.   DESCRIPTION OF PROCEDURE:  The procedure had been explained to the patient  and consent obtained.  The adult adjustable colonoscope was used.  A digital  examination was performed and the scope was inserted and advanced.  The prep  was excellent.  We were able to reach the cecum without difficulty.  Ileocecal valve and appendiceal orifice were seen.  The scope was withdrawn  and the cecum, ascending colon, transverse colon, descending and sigmoid  colon were seen well.  No polyps or other lesions were seen.  There was no  significant diverticular disease.  The scope was withdrawn down in the  rectum.  The rectum was free of polyps.  The scope was withdrawn and the  patient tolerated the procedure well.   ASSESSMENT:  A previous history of adenomatous polyps with negative  examination at this time.  V12.72   PLAN:  Will recommend yearly hemoccults and repeat colonoscopy in five  years.                                               James L. Malon Kindle., M.D.    Waldron Session  D:  11/04/2003  T:  11/04/2003  Job:  [id number]   cc:   Angelena Sole, M.D. [facility]

## 2010-09-24 ENCOUNTER — Other Ambulatory Visit: Payer: Self-pay | Admitting: Oncology

## 2010-09-24 ENCOUNTER — Encounter (HOSPITAL_BASED_OUTPATIENT_CLINIC_OR_DEPARTMENT_OTHER): Payer: BC Managed Care – PPO | Admitting: Oncology

## 2010-09-24 DIAGNOSIS — C01 Malignant neoplasm of base of tongue: Secondary | ICD-10-CM

## 2010-09-24 LAB — CBC WITH DIFFERENTIAL/PLATELET
BASO%: 0.4 % (ref 0.0–2.0)
EOS%: 8.8 % — ABNORMAL HIGH (ref 0.0–7.0)
LYMPH%: 21 % (ref 14.0–49.0)
MCHC: 34.6 g/dL (ref 32.0–36.0)
MCV: 96.9 fL (ref 79.3–98.0)
MONO%: 9.3 % (ref 0.0–14.0)
NEUT#: 3.3 10*3/uL (ref 1.5–6.5)
Platelets: 208 10*3/uL (ref 140–400)
RBC: 4.17 10*6/uL — ABNORMAL LOW (ref 4.20–5.82)
RDW: 12.9 % (ref 11.0–14.6)

## 2010-09-24 LAB — COMPREHENSIVE METABOLIC PANEL
ALT: 21 U/L (ref 0–53)
AST: 19 U/L (ref 0–37)
Albumin: 4.7 g/dL (ref 3.5–5.2)
Alkaline Phosphatase: 46 U/L (ref 39–117)
Potassium: 4.6 mEq/L (ref 3.5–5.3)
Sodium: 140 mEq/L (ref 135–145)
Total Bilirubin: 0.8 mg/dL (ref 0.3–1.2)
Total Protein: 6.9 g/dL (ref 6.0–8.3)

## 2011-02-24 ENCOUNTER — Encounter: Payer: Self-pay | Admitting: *Deleted

## 2011-02-24 NOTE — Progress Notes (Signed)
Married, Licensed conveyancer, 2 children, , hx peg tube placement  during rad txs 09/27/06-11/08/06 Chemotherapy cisplatin during rad txs, NKDA

## 2011-02-28 ENCOUNTER — Ambulatory Visit: Payer: BC Managed Care – PPO | Admitting: Radiation Oncology

## 2011-03-08 ENCOUNTER — Encounter: Payer: Self-pay | Admitting: *Deleted

## 2011-03-11 ENCOUNTER — Encounter: Payer: Self-pay | Admitting: *Deleted

## 2011-03-14 ENCOUNTER — Ambulatory Visit
Admission: RE | Admit: 2011-03-14 | Discharge: 2011-03-14 | Disposition: A | Discharge status: Home / Self Care | Payer: BC Managed Care – PPO | Source: Ambulatory Visit | Attending: Radiation Oncology | Admitting: Radiation Oncology

## 2011-03-14 ENCOUNTER — Encounter: Payer: Self-pay | Admitting: Radiation Oncology

## 2011-03-14 VITALS — BP 131/88 | HR 69 | Temp 98.3°F | Resp 20 | Wt 261.4 lb

## 2011-03-14 DIAGNOSIS — J309 Allergic rhinitis, unspecified: Secondary | ICD-10-CM

## 2011-03-14 NOTE — Progress Notes (Signed)
Pt here for f/u, slight dry mouth, eating and drinking well, no new meds 9:21 AM

## 2011-03-15 NOTE — Progress Notes (Signed)
CC:   Kristine Garbe. Ezzard Standing, M.D. Benjiman Core, M.D. Avis Epley, MD  DIAGNOSIS:  Stage III base of tongue carcinoma.  INTERVAL SINCE RADIATION THERAPY:  4 years and 5 months.  NARRATIVE:  Mr. Carlos Castaneda comes in today for routine followup.  He clinically seems to be doing well at this time.  He is scheduled to see Dr. Clelia Croft later this month with chest x-ray prior to this visit.  The patient did see Dr. Ezzard Standing in the fall and is scheduled for additional followup in spring of next year.  The patient denies any odynophagia, dysphagia. The only food or drink that he cannot tolerate well is carbonated beverages.  The patient's taste is good.  The patient continues to get his thyroid function studies performed through his primary care physician.  EXAMINATION:  Vital Signs:  The patient's weight is 261 which is stable. The patient's temperature is 98.3, pulse 69, blood pressure is 131/88. Examination of the lungs reveals them to be clear.  Heart:  Regular rhythm and rate.  Examination the neck area reveals some fibrocystic along the right side.  There is no palpable adenopathy in the neck or supraclavicular region.  Examination of the oral cavity and posterior pharynx reveals no secondary infection or mucosal lesion.  The mucosa is moist.  On indirect mirror examination, there are no lesions noted in the vallecular or base of tongue area.  The patient's vocal cords move well on examination.  Palpation along the base of tongue and oral cavity reveals no suspicious induration.  IMPRESSION AND PLAN:  Clinically no evidence of disease.  Since the patient is almost out 5 years and will see Dr. Ezzard Standing next spring for followup, I have not scheduled Mr. Bodie for formal followup appointment, but would be glad to see him at any time.    ______________________________ Billie Lade, Ph.D., M.D. JDK/MEDQ  D:  03/14/2011  T:  03/15/2011  Job:  [id number]

## 2011-03-22 ENCOUNTER — Other Ambulatory Visit: Payer: Self-pay | Admitting: Oncology

## 2011-03-22 ENCOUNTER — Ambulatory Visit (HOSPITAL_COMMUNITY)
Admission: RE | Admit: 2011-03-22 | Discharge: 2011-03-22 | Disposition: A | Discharge status: Home / Self Care | Payer: BC Managed Care – PPO | Source: Ambulatory Visit | Attending: Oncology | Admitting: Oncology

## 2011-03-22 DIAGNOSIS — Z87891 Personal history of nicotine dependence: Secondary | ICD-10-CM | POA: Insufficient documentation

## 2011-03-22 DIAGNOSIS — Z9221 Personal history of antineoplastic chemotherapy: Secondary | ICD-10-CM | POA: Insufficient documentation

## 2011-03-22 DIAGNOSIS — F17200 Nicotine dependence, unspecified, uncomplicated: Secondary | ICD-10-CM

## 2011-03-22 DIAGNOSIS — Z923 Personal history of irradiation: Secondary | ICD-10-CM | POA: Insufficient documentation

## 2011-03-22 DIAGNOSIS — Z981 Arthrodesis status: Secondary | ICD-10-CM | POA: Insufficient documentation

## 2011-03-22 DIAGNOSIS — C029 Malignant neoplasm of tongue, unspecified: Secondary | ICD-10-CM | POA: Insufficient documentation

## 2011-03-24 ENCOUNTER — Telehealth: Payer: Self-pay | Admitting: Oncology

## 2011-03-24 ENCOUNTER — Other Ambulatory Visit: Payer: Self-pay | Admitting: Oncology

## 2011-03-24 ENCOUNTER — Ambulatory Visit (HOSPITAL_BASED_OUTPATIENT_CLINIC_OR_DEPARTMENT_OTHER): Payer: BC Managed Care – PPO | Admitting: Oncology

## 2011-03-24 ENCOUNTER — Other Ambulatory Visit (HOSPITAL_BASED_OUTPATIENT_CLINIC_OR_DEPARTMENT_OTHER): Payer: BC Managed Care – PPO

## 2011-03-24 VITALS — BP 137/91 | HR 76 | Temp 97.2°F | Ht 68.0 in | Wt 258.7 lb

## 2011-03-24 DIAGNOSIS — R42 Dizziness and giddiness: Secondary | ICD-10-CM

## 2011-03-24 DIAGNOSIS — C01 Malignant neoplasm of base of tongue: Secondary | ICD-10-CM

## 2011-03-24 DIAGNOSIS — D0000 Carcinoma in situ of oral cavity, unspecified site: Secondary | ICD-10-CM

## 2011-03-24 LAB — COMPREHENSIVE METABOLIC PANEL
ALT: 29 U/L (ref 0–53)
AST: 27 U/L (ref 0–37)
Alkaline Phosphatase: 47 U/L (ref 39–117)
Calcium: 10 mg/dL (ref 8.4–10.5)
Chloride: 103 mEq/L (ref 96–112)
Creatinine, Ser: 0.8 mg/dL (ref 0.50–1.35)

## 2011-03-24 LAB — CBC WITH DIFFERENTIAL/PLATELET
BASO%: 0.5 % (ref 0.0–2.0)
EOS%: 7.5 % — ABNORMAL HIGH (ref 0.0–7.0)
HCT: 44.2 % (ref 38.4–49.9)
MCH: 34.2 pg — ABNORMAL HIGH (ref 27.2–33.4)
MCHC: 35.5 g/dL (ref 32.0–36.0)
NEUT%: 68.9 % (ref 39.0–75.0)
RDW: 12.8 % (ref 11.0–14.6)
lymph#: 1.1 10*3/uL (ref 0.9–3.3)

## 2011-03-24 NOTE — Telephone Encounter (Signed)
gv pt appt schedule for July.  

## 2011-03-24 NOTE — Progress Notes (Signed)
Hematology and Oncology Follow Up Visit  Carlos Castaneda [id number] [birth year] 60 y.o. 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    Prior Therapy: Patient received definitive radiation therapy with weekly cisplatin therapy concluded in July of 2008.  He had a complete response.  No evidence to suggest recurrent metastatic disease.   Current therapy: Observation and surveillance   Interim History:  Carlos Castaneda presents today for a followup visit.  He has continued to do very well without any major changes in his health or his performance status.  He did not report any dysphagia, did not report any odynophagia, did not report any difficulty swallowing.  His activity level remains excellent.  Had not reported any respiratory symptoms of cough or shortness of breath.  His, again, overall activity level remains excellent. He has been seen by Dr. Roselind Messier and Dr. Ezzard Standing and have been cleared by both them.  Medications: I have reviewed the patient's current medications. Current outpatient prescriptions:aspirin 81 MG tablet, Take 81 mg by mouth daily.  , Disp: , Rfl: ;  fish oil-omega-3 fatty acids 1000 MG capsule, Take 2 g by mouth daily.  , Disp: , Rfl: ;  fluticasone (FLONASE) 50 MCG/ACT nasal spray, Place 2 sprays into the nose as needed.  , Disp: , Rfl: ;  gabapentin (NEURONTIN) 300 MG capsule, Take 300 mg by mouth 3 (three) times daily. Pt takes 2 tabs nightly , Disp: , Rfl:  HYDROcodone-acetaminophen (VICODIN) 5-500 MG per tablet, Take 1 tablet by mouth at bedtime as needed. 1-2 tabs qhs prn , Disp: , Rfl: ;  lisinopril (PRINIVIL,ZESTRIL) 40 MG tablet, Take 40 mg by mouth daily.  , Disp: , Rfl: ;  metFORMIN (GLUCOPHAGE) 500 MG tablet, Take 500 mg by mouth 2 (two) times daily with a meal.  , Disp: , Rfl: ;  Multiple Vitamin (MULTIVITAMIN) tablet, Take 1 tablet by mouth daily.  , Disp:  , Rfl:  rosuvastatin (CRESTOR) 20 MG tablet, Take 20 mg by mouth every evening.  , Disp: , Rfl:   Allergies: No Known Allergies  Past Medical History, Surgical history, Social history, and Family History were reviewed and updated.  Review of Systems: Constitutional:  Negative for fever, chills, night sweats, anorexia, weight loss, pain. Cardiovascular: no chest pain or dyspnea on exertion Respiratory: no cough, shortness of breath, or wheezing Neurological: no TIA or stroke symptoms Dermatological: negative ENT: negative Skin: Negative. Gastrointestinal: no abdominal pain, change in bowel habits, or black or bloody stools Genito-Urinary: no dysuria, trouble voiding, or hematuria Hematological and Lymphatic: negative Breast: negative Musculoskeletal: negative Remaining ROS negative. Physical Exam: Blood pressure 137/91, pulse 76, temperature 97.2 F (36.2 C), temperature source Oral, height 5\' 8"  (1.727 m), weight 258 lb 11.2 oz (117.346 kg). ECOG: 1 General appearance: alert Head: Normocephalic, without obvious abnormality, atraumatic Neck: no adenopathy, no carotid bruit, no JVD, supple, symmetrical, trachea midline and thyroid not enlarged, symmetric, no tenderness/mass/nodules Lymph nodes: Cervical, supraclavicular, and axillary nodes normal. Heart:regular rate and rhythm, S1, S2 normal, no murmur, click, rub or gallop Lung:chest clear, no wheezing, rales, normal symmetric air entry Abdomin: soft, non-tender, without masses or organomegaly EXT:no erythema, induration, or nodules   Lab Results: Lab Results  Component Value Date   WBC 7.2 03/24/2011   HGB 15.7 03/24/2011   HCT 44.2 03/24/2011   MCV 96.5 03/24/2011   PLT  207 03/24/2011     Chemistry      Component Value Date/Time   NA 140 09/24/2010 0914   K 4.6 09/24/2010 0914   CL 104 09/24/2010 0914   CO2 29 09/24/2010 0914   BUN 17 09/24/2010 0914   CREATININE 0.98 09/24/2010 0914      Component Value Date/Time    CALCIUM 10.1 09/24/2010 0914   ALKPHOS 46 09/24/2010 0914   AST 19 09/24/2010 0914   ALT 21 09/24/2010 0914   BILITOT 0.8 09/24/2010 0914       Radiological Studies:  CHEST - 2 VIEW  Comparison: 03/19/2010  Findings:  Normal heart size and pulmonary vascularity.  Tortuous aorta.  Lungs clear.  No pleural effusion or pneumothorax.  Prior cervical spine fusion.  Retrosternal soft tissue density at the inferior sternum appears  stable since 2010.  Questionable 5 mm diameter nodular density at the left costophrenic  angle on the PA view appears to be related to the anterior aspect  of the left seventh rib and is likely an artifact.  IMPRESSION:  No definite acute thoracic abnormalities.    Impression and Plan:    A 60 year old with the following issues: 1. T1 N2 squamous cell carcinoma of the base of tongue, status post definitive radiation therapy with weekly cisplatin therapy concluded in July of 2008.  No evidence of any recurrent disease.  He will continue to be on active surveillance.  The plan will be at this point to obtain chest x-ray in December of 2013.  His last chest x-ray done in December of 2012 really did not show any abnormalities and after July of 2013 we will see him on an annual basis.  2. Eosinophilia that has resolved at this point. 3. Dizziness and vertigo that have also improved dramatically at this point.      [facility], MD 12/13/20129:48 AM

## 2011-10-21 ENCOUNTER — Telehealth: Payer: Self-pay | Admitting: Oncology

## 2011-10-21 ENCOUNTER — Other Ambulatory Visit: Payer: BC Managed Care – PPO | Admitting: Lab

## 2011-10-21 ENCOUNTER — Ambulatory Visit (HOSPITAL_BASED_OUTPATIENT_CLINIC_OR_DEPARTMENT_OTHER): Payer: BC Managed Care – PPO | Admitting: Oncology

## 2011-10-21 VITALS — BP 134/94 | HR 69 | Temp 97.8°F | Ht 68.0 in | Wt 257.7 lb

## 2011-10-21 DIAGNOSIS — D0008 Carcinoma in situ of pharynx: Secondary | ICD-10-CM

## 2011-10-21 DIAGNOSIS — C801 Malignant (primary) neoplasm, unspecified: Secondary | ICD-10-CM

## 2011-10-21 LAB — CBC WITH DIFFERENTIAL/PLATELET
BASO%: 1.3 % (ref 0.0–2.0)
EOS%: 8.1 % — ABNORMAL HIGH (ref 0.0–7.0)
MCH: 34 pg — ABNORMAL HIGH (ref 27.2–33.4)
MCHC: 34.6 g/dL (ref 32.0–36.0)
MCV: 98.4 fL — ABNORMAL HIGH (ref 79.3–98.0)
MONO%: 9.9 % (ref 0.0–14.0)
RBC: 4.43 10*6/uL (ref 4.20–5.82)
RDW: 12.9 % (ref 11.0–14.6)

## 2011-10-21 LAB — COMPREHENSIVE METABOLIC PANEL
AST: 25 U/L (ref 0–37)
Albumin: 4.2 g/dL (ref 3.5–5.2)
Alkaline Phosphatase: 49 U/L (ref 39–117)
BUN: 15 mg/dL (ref 6–23)
CO2: 24 mEq/L (ref 19–32)
Calcium: 9.7 mg/dL (ref 8.4–10.5)
Glucose, Bld: 110 mg/dL — ABNORMAL HIGH (ref 70–99)
Total Protein: 6.7 g/dL (ref 6.0–8.3)

## 2011-10-21 NOTE — Telephone Encounter (Signed)
gv pt appt schedule for July 2014.  °

## 2011-10-21 NOTE — Progress Notes (Signed)
Hematology and Oncology Follow Up Visit  Carlos Castaneda [id number] [birth year] 61 y.o. 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    Prior Therapy: Patient received definitive radiation therapy with weekly cisplatin therapy concluded in July of 2008.  He had a complete response.  No evidence to suggest recurrent metastatic disease.  Current therapy: Observation and surveillance  Interim History:  Carlos Castaneda presents today for a followup visit.  He has continued to do very well without any major changes in his health or his performance status.  He did not report any dysphagia, did not report any odynophagia, did not report any difficulty swallowing.  His activity level remains excellent.  Had not reported any respiratory symptoms of cough or shortness of breath.  His, again, overall activity level remains excellent. He has been followed by Dr. Ezzard Standing as well. He is not reporting any new symptoms.   Medications: I have reviewed the patient's current medications. Current outpatient prescriptions:aspirin 81 MG tablet, Take 81 mg by mouth daily.  , Disp: , Rfl: ;  fish oil-omega-3 fatty acids 1000 MG capsule, Take 2 g by mouth daily.  , Disp: , Rfl: ;  fluticasone (FLONASE) 50 MCG/ACT nasal spray, Place 2 sprays into the nose as needed.  , Disp: , Rfl: ;  gabapentin (NEURONTIN) 300 MG capsule, Take 300 mg by mouth 3 (three) times daily. Pt takes 2 tabs nightly , Disp: , Rfl:  HYDROcodone-acetaminophen (VICODIN) 5-500 MG per tablet, Take 1 tablet by mouth at bedtime as needed. 1-2 tabs qhs prn , Disp: , Rfl: ;  lisinopril (PRINIVIL,ZESTRIL) 40 MG tablet, Take 40 mg by mouth daily.  , Disp: , Rfl: ;  Multiple Vitamin (MULTIVITAMIN) tablet, Take 1 tablet by mouth daily.  , Disp: , Rfl: ;  rosuvastatin (CRESTOR) 20 MG tablet, Take 20 mg by mouth every evening.  , Disp: , Rfl:   Allergies:  No Known Allergies  Past Medical History, Surgical history, Social history, and Family History were reviewed and updated.  Review of Systems: Constitutional:  Negative for fever, chills, night sweats, anorexia, weight loss, pain. Cardiovascular: no chest pain or dyspnea on exertion Respiratory: no cough, shortness of breath, or wheezing Neurological: no TIA or stroke symptoms Dermatological: negative ENT: negative Skin: Negative. Gastrointestinal: no abdominal pain, change in bowel habits, or black or bloody stools Genito-Urinary: no dysuria, trouble voiding, or hematuria Hematological and Lymphatic: negative Breast: negative Musculoskeletal: negative Remaining ROS negative. Physical Exam: Blood pressure 134/94, pulse 69, temperature 97.8 F (36.6 C), temperature source Oral, height 5\' 8"  (1.727 m), weight 257 lb 11.2 oz (116.892 kg). ECOG: 1 General appearance: alert Head: Normocephalic, without obvious abnormality, atraumatic Neck: no adenopathy, no carotid bruit, no JVD, supple, symmetrical, trachea midline and thyroid not enlarged, symmetric, no tenderness/mass/nodules Lymph nodes: Cervical, supraclavicular, and axillary nodes normal. Heart:regular rate and rhythm, S1, S2 normal, no murmur, click, rub or gallop Lung:chest clear, no wheezing, rales, normal symmetric air entry Abdomin: soft, non-tender, without masses or organomegaly EXT:no erythema, induration, or nodules   Lab Results: Lab Results  Component Value Date   WBC 6.1 10/21/2011   HGB 15.1 10/21/2011   HCT 43.6 10/21/2011   MCV 98.4* 10/21/2011   PLT 202 10/21/2011     Chemistry      Component Value Date/Time   NA 137 03/24/2011 0922   K 4.3  03/24/2011 0922   CL 103 03/24/2011 0922   CO2 24 03/24/2011 0922   BUN 19 03/24/2011 0922   CREATININE 0.80 03/24/2011 0922      Component Value Date/Time   CALCIUM 10.0 03/24/2011 0922   ALKPHOS 47 03/24/2011 0922   AST 27 03/24/2011 0922   ALT 29 03/24/2011 0922    BILITOT 0.8 03/24/2011 0922       Radiological Studies:  CHEST - 2 VIEW  Comparison: 03/19/2010  Findings:  Normal heart size and pulmonary vascularity.  Tortuous aorta.  Lungs clear.  No pleural effusion or pneumothorax.  Prior cervical spine fusion.  Retrosternal soft tissue density at the inferior sternum appears  stable since 2010.  Questionable 5 mm diameter nodular density at the left costophrenic  angle on the PA view appears to be related to the anterior aspect  of the left seventh rib and is likely an artifact.  IMPRESSION:  No definite acute thoracic abnormalities.    Impression and Plan:    A 61 year old with the following issues: 1. T1 N2 squamous cell carcinoma of the base of tongue, status post definitive radiation therapy with weekly cisplatin therapy concluded in July of 2008.  No evidence of any recurrent disease.  He will continue to be on active surveillance.  The plan will be at this point to obtain chest x-ray with his next visit.  His last chest x-ray done in December of 2012 really did not show any abnormalities.  2. 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 Likely due to allergy. 3. Follow up: it will be done yearly at this time.       [facility], MD 7/12/20139:05 AM

## 2011-10-26 ENCOUNTER — Encounter: Payer: Self-pay | Admitting: Oncology

## 2012-10-11 ENCOUNTER — Other Ambulatory Visit (HOSPITAL_BASED_OUTPATIENT_CLINIC_OR_DEPARTMENT_OTHER): Payer: BC Managed Care – PPO | Admitting: Lab

## 2012-10-11 ENCOUNTER — Other Ambulatory Visit: Payer: Self-pay | Admitting: Oncology

## 2012-10-11 ENCOUNTER — Ambulatory Visit (HOSPITAL_COMMUNITY)
Admission: RE | Admit: 2012-10-11 | Discharge: 2012-10-11 | Disposition: A | Discharge status: Home / Self Care | Payer: BC Managed Care – PPO | Source: Ambulatory Visit | Attending: Oncology | Admitting: Oncology

## 2012-10-11 DIAGNOSIS — Z981 Arthrodesis status: Secondary | ICD-10-CM | POA: Insufficient documentation

## 2012-10-11 DIAGNOSIS — C801 Malignant (primary) neoplasm, unspecified: Secondary | ICD-10-CM

## 2012-10-11 DIAGNOSIS — C029 Malignant neoplasm of tongue, unspecified: Secondary | ICD-10-CM

## 2012-10-11 DIAGNOSIS — J984 Other disorders of lung: Secondary | ICD-10-CM | POA: Insufficient documentation

## 2012-10-11 DIAGNOSIS — Z859 Personal history of malignant neoplasm, unspecified: Secondary | ICD-10-CM | POA: Insufficient documentation

## 2012-10-11 DIAGNOSIS — I10 Essential (primary) hypertension: Secondary | ICD-10-CM | POA: Insufficient documentation

## 2012-10-11 LAB — COMPREHENSIVE METABOLIC PANEL (CC13)
ALT: 26 U/L (ref 0–55)
AST: 21 U/L (ref 5–34)
Albumin: 3.8 g/dL (ref 3.5–5.0)
BUN: 16.5 mg/dL (ref 7.0–26.0)
Calcium: 9.7 mg/dL (ref 8.4–10.4)
Chloride: 105 mEq/L (ref 98–109)
Potassium: 3.9 mEq/L (ref 3.5–5.1)
Sodium: 139 mEq/L (ref 136–145)
Total Protein: 6.9 g/dL (ref 6.4–8.3)

## 2012-10-11 LAB — CBC WITH DIFFERENTIAL/PLATELET
Basophils Absolute: 0 10*3/uL (ref 0.0–0.1)
EOS%: 9.5 % — ABNORMAL HIGH (ref 0.0–7.0)
Eosinophils Absolute: 0.6 10*3/uL — ABNORMAL HIGH (ref 0.0–0.5)
HGB: 14.7 g/dL (ref 13.0–17.1)
MCH: 33.3 pg (ref 27.2–33.4)
NEUT#: 3.8 10*3/uL (ref 1.5–6.5)
RDW: 12.8 % (ref 11.0–14.6)
lymph#: 1.2 10*3/uL (ref 0.9–3.3)

## 2012-10-19 ENCOUNTER — Telehealth: Payer: Self-pay | Admitting: Oncology

## 2012-10-19 ENCOUNTER — Ambulatory Visit (HOSPITAL_BASED_OUTPATIENT_CLINIC_OR_DEPARTMENT_OTHER): Payer: BC Managed Care – PPO | Admitting: Oncology

## 2012-10-19 VITALS — BP 142/91 | HR 75 | Temp 98.0°F | Resp 20 | Ht 68.0 in | Wt 256.0 lb

## 2012-10-19 DIAGNOSIS — C760 Malignant neoplasm of head, face and neck: Secondary | ICD-10-CM

## 2012-10-19 NOTE — Telephone Encounter (Signed)
gv and printeda ppt sched and avs for pt....pt aware of xray

## 2012-10-19 NOTE — Progress Notes (Signed)
Hematology and Oncology Follow Up Visit  Carlos Castaneda [id number] Jan 21, 1951 62 y.o. 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  Prior Therapy: Patient received definitive radiation therapy with weekly cisplatin therapy concluded in July of 2008.  He had a complete response.  No evidence to suggest recurrent metastatic disease.  Current therapy: Observation and surveillance  Interim History:  Mr. [name] presents today for a followup visit.  He has continued to do very well without any major changes in his health or his performance status.  He did not report any dysphagia, did not report any odynophagia, did not report any difficulty swallowing.  His activity level remains excellent.  Had not reported any respiratory symptoms of cough or shortness of breath.  His, again, overall activity level remains excellent. No new complaints today.   Medications: I have reviewed the patient's current medications.  Current Outpatient Prescriptions  Medication Sig Dispense Refill  . aspirin 81 MG tablet Take 81 mg by mouth daily.        . fish oil-omega-3 fatty acids 1000 MG capsule Take 2 g by mouth daily.        Marland Kitchen gabapentin (NEURONTIN) 300 MG capsule Take 300 mg by mouth 3 (three) times daily. Pt takes 2 tabs nightly       . HYDROcodone-acetaminophen (VICODIN) 5-500 MG per tablet Take 1 tablet by mouth at bedtime as needed. 1-2 tabs qhs prn       . Linaclotide (LINZESS) 290 MCG CAPS Take 1 capsule by mouth as needed.      Marland Kitchen lisinopril (PRINIVIL,ZESTRIL) 40 MG tablet Take 40 mg by mouth daily.        . Multiple Vitamin (MULTIVITAMIN) tablet Take 1 tablet by mouth daily.        . rosuvastatin (CRESTOR) 20 MG tablet Take 20 mg by mouth every evening.         No current facility-administered medications for this visit.    Allergies: No Known Allergies  Past Medical History, Surgical  history, Social history, and Family History were reviewed and updated.  Review of Systems: Constitutional:  Negative for fever, chills, night sweats, anorexia, weight loss, pain. Cardiovascular: no chest pain or dyspnea on exertion Respiratory: no cough, shortness of breath, or wheezing Neurological: no TIA or stroke symptoms Dermatological: negative ENT: negative Skin: Negative. Gastrointestinal: no abdominal pain, change in bowel habits, or black or bloody stools Genito-Urinary: no dysuria, trouble voiding, or hematuria Hematological and Lymphatic: negative Breast: negative Musculoskeletal: negative Remaining ROS negative. Physical Exam: Blood pressure 142/91, pulse 75, temperature 98 F (36.7 C), temperature source Oral, resp. rate 20, height 5\' 8"  (1.727 m), weight 256 lb (116.121 kg). ECOG: 1 General appearance: alert Head: Normocephalic, without obvious abnormality, atraumatic Neck: no adenopathy, no carotid bruit, no JVD, supple, symmetrical, trachea midline and thyroid not enlarged, symmetric, no tenderness/mass/nodules Lymph nodes: Cervical, supraclavicular, and axillary nodes normal. Heart:regular rate and rhythm, S1, S2 normal, no murmur, click, rub or gallop Lung:chest clear, no wheezing, rales, normal symmetric air entry Abdomin: soft, non-tender, without masses or organomegaly EXT:no erythema, induration, or nodules   Lab Results: Lab Results  Component Value Date   WBC 6.3 10/11/2012   HGB 14.7 10/11/2012   HCT 41.8 10/11/2012   MCV 94.8 10/11/2012   PLT 196 10/11/2012     Chemistry      Component Value  Date/Time   NA 139 10/11/2012 0811   NA 138 10/21/2011 0818   K 3.9 10/11/2012 0811   K 4.4 10/21/2011 0818   CL 104 10/21/2011 0818   CO2 27 10/11/2012 0811   CO2 24 10/21/2011 0818   BUN 16.5 10/11/2012 0811   BUN 15 10/21/2011 0818   CREATININE 0.8 10/11/2012 0811   CREATININE 0.85 10/21/2011 0818      Component Value Date/Time   CALCIUM 9.7 10/11/2012 0811   CALCIUM 9.7  10/21/2011 0818   ALKPHOS 55 10/11/2012 0811   ALKPHOS 49 10/21/2011 0818   AST 21 10/11/2012 0811   AST 25 10/21/2011 0818   ALT 26 10/11/2012 0811   ALT 30 10/21/2011 0818   BILITOT 0.64 10/11/2012 0811   BILITOT 0.6 10/21/2011 0818      Impression and Plan:    A 62 year old with the following issues: 1. T1 N2 squamous cell carcinoma of the base of tongue, status post definitive radiation therapy with weekly cisplatin therapy concluded in July of 2008.  No evidence of any recurrent disease based on his exam and chest xray that was obtained on 10/11/2012.  He will continue to be on active surveillance.  The plan will be at this point to obtain chest x-ray with his next visit in 12 months.  2. 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 Likely due to allergy. 3. Follow up: it will be done yearly at this time.       [facility], MD 7/11/20148:52 AM

## 2013-02-14 ENCOUNTER — Other Ambulatory Visit: Payer: Self-pay

## 2013-10-18 ENCOUNTER — Ambulatory Visit (HOSPITAL_COMMUNITY)
Admission: RE | Admit: 2013-10-18 | Discharge: 2013-10-18 | Disposition: A | Discharge status: Home / Self Care | Payer: BC Managed Care – PPO | Source: Ambulatory Visit | Attending: Oncology | Admitting: Oncology

## 2013-10-18 ENCOUNTER — Other Ambulatory Visit (HOSPITAL_BASED_OUTPATIENT_CLINIC_OR_DEPARTMENT_OTHER): Payer: BC Managed Care – PPO

## 2013-10-18 DIAGNOSIS — I10 Essential (primary) hypertension: Secondary | ICD-10-CM | POA: Insufficient documentation

## 2013-10-18 DIAGNOSIS — C760 Malignant neoplasm of head, face and neck: Secondary | ICD-10-CM

## 2013-10-18 DIAGNOSIS — Z8589 Personal history of malignant neoplasm of other organs and systems: Secondary | ICD-10-CM | POA: Insufficient documentation

## 2013-10-18 LAB — COMPREHENSIVE METABOLIC PANEL (CC13)
ALBUMIN: 4 g/dL (ref 3.5–5.0)
ALK PHOS: 49 U/L (ref 40–150)
ALT: 19 U/L (ref 0–55)
AST: 18 U/L (ref 5–34)
Anion Gap: 10 mEq/L (ref 3–11)
BUN: 16.5 mg/dL (ref 7.0–26.0)
CO2: 24 mEq/L (ref 22–29)
Calcium: 9.4 mg/dL (ref 8.4–10.4)
Chloride: 106 mEq/L (ref 98–109)
Creatinine: 0.9 mg/dL (ref 0.7–1.3)
Glucose: 132 mg/dl (ref 70–140)
POTASSIUM: 4.3 meq/L (ref 3.5–5.1)
SODIUM: 140 meq/L (ref 136–145)
TOTAL PROTEIN: 6.7 g/dL (ref 6.4–8.3)
Total Bilirubin: 0.48 mg/dL (ref 0.20–1.20)

## 2013-10-18 LAB — CBC WITH DIFFERENTIAL/PLATELET
BASO%: 1.3 % (ref 0.0–2.0)
BASOS ABS: 0.1 10*3/uL (ref 0.0–0.1)
EOS ABS: 0.6 10*3/uL — AB (ref 0.0–0.5)
EOS%: 8.8 % — ABNORMAL HIGH (ref 0.0–7.0)
HEMATOCRIT: 44.1 % (ref 38.4–49.9)
HGB: 14.9 g/dL (ref 13.0–17.1)
LYMPH%: 14.5 % (ref 14.0–49.0)
MCH: 32.2 pg (ref 27.2–33.4)
MCHC: 33.8 g/dL (ref 32.0–36.0)
MCV: 95.2 fL (ref 79.3–98.0)
MONO#: 0.7 10*3/uL (ref 0.1–0.9)
MONO%: 9.8 % (ref 0.0–14.0)
NEUT%: 65.6 % (ref 39.0–75.0)
NEUTROS ABS: 4.5 10*3/uL (ref 1.5–6.5)
PLATELETS: 219 10*3/uL (ref 140–400)
RBC: 4.63 10*6/uL (ref 4.20–5.82)
RDW: 13.9 % (ref 11.0–14.6)
WBC: 6.8 10*3/uL (ref 4.0–10.3)
lymph#: 1 10*3/uL (ref 0.9–3.3)

## 2013-10-25 ENCOUNTER — Ambulatory Visit (HOSPITAL_BASED_OUTPATIENT_CLINIC_OR_DEPARTMENT_OTHER): Payer: BC Managed Care – PPO | Admitting: Physician Assistant

## 2013-10-25 ENCOUNTER — Ambulatory Visit: Payer: BC Managed Care – PPO | Admitting: Oncology

## 2013-10-25 VITALS — BP 144/82 | HR 71 | Temp 98.6°F | Resp 19 | Ht 68.0 in | Wt 250.9 lb

## 2013-10-25 DIAGNOSIS — D0000 Carcinoma in situ of oral cavity, unspecified site: Secondary | ICD-10-CM

## 2013-10-25 DIAGNOSIS — D0001 Carcinoma in situ of labial mucosa and vermilion border: Principal | ICD-10-CM

## 2013-10-25 DIAGNOSIS — D0008 Carcinoma in situ of pharynx: Principal | ICD-10-CM

## 2013-10-25 DIAGNOSIS — Z85819 Personal history of malignant neoplasm of unspecified site of lip, oral cavity, and pharynx: Secondary | ICD-10-CM

## 2013-10-25 DIAGNOSIS — Z923 Personal history of irradiation: Secondary | ICD-10-CM

## 2013-10-29 NOTE — Patient Instructions (Signed)
Your chest x-ray reveals no evidence of disease recurrence or progression. Urinalysis 70 years out from her diagnosis and we are releasing you to the care of her primary care physician. We will be available on should you need our expertise in the future

## 2013-10-29 NOTE — Progress Notes (Signed)
Hematology and Oncology Follow Up Visit  Carlos Castaneda [id number] 09-25-1950 63 y.o. 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  Prior Therapy: Patient received definitive radiation therapy with weekly cisplatin therapy concluded in July of 2008.  He had a complete response.  No evidence to suggest recurrent metastatic disease.  Current therapy: Observation and surveillance  Interim History:  Carlos Castaneda presents today for a followup visit.  He has continued to do very well without any major changes in his health or his performance status.  He did not report any dysphagia, did not report any odynophagia, did not report any difficulty swallowing.  His activity level remains excellent.  Had not reported any respiratory symptoms of cough or shortness of breath or hemoptysis.  His overall activity level and performance status remains excellent. No new complaints today.   Medications: I have reviewed the patient's current medications.  Current Outpatient Prescriptions  Medication Sig Dispense Refill  . aspirin 81 MG tablet Take 81 mg by mouth daily.        . Cholecalciferol (D 1000) 1000 UNITS capsule Take by mouth.      . FIBER, CORN DEXTRIN, PO Take by mouth.      . fish oil-omega-3 fatty acids 1000 MG capsule Take 2 g by mouth daily.        Marland Kitchen gabapentin (NEURONTIN) 300 MG capsule Take 300 mg by mouth 3 (three) times daily. Pt takes 2 tabs nightly       . HYDROcodone-acetaminophen (VICODIN) 5-500 MG per tablet Take 1 tablet by mouth at bedtime as needed. 1-2 tabs qhs prn       . Linaclotide (LINZESS) 290 MCG CAPS Take 1 capsule by mouth as needed.      Marland Kitchen lisinopril (PRINIVIL,ZESTRIL) 40 MG tablet Take 40 mg by mouth daily.        . Misc Natural Products (GLUCOSAMINE CHONDROITIN ADV) TABS Take by mouth.      . Multiple Vitamin (MULTIVITAMIN) tablet Take 1 tablet by mouth  daily.        . rosuvastatin (CRESTOR) 20 MG tablet Take 20 mg by mouth every evening.        . senna-docusate (GNP STOOL SOFTENER/LAXATIVE) 8.6-50 MG per tablet Take by mouth.       No current facility-administered medications for this visit.    Allergies: No Known Allergies  Past Medical History, Surgical history, Social history, and Family History were reviewed and updated.  Review of Systems: Constitutional:  Negative for fever, chills, night sweats, anorexia, weight loss, pain. Cardiovascular: no chest pain or dyspnea on exertion Respiratory: no cough, shortness of breath, or wheezing Neurological: no TIA or stroke symptoms Dermatological: negative ENT: negative Skin: Negative. Gastrointestinal: no abdominal pain, change in bowel habits, or black or bloody stools Genito-Urinary: no dysuria, trouble voiding, or hematuria Hematological and Lymphatic: negative Breast: negative Musculoskeletal: negative Remaining ROS negative. Physical Exam: Blood pressure 144/82, pulse 71, temperature 98.6 F (37 C), temperature source Oral, resp. rate 19, height 5\' 8"  (1.727 m), weight 250 lb 14.4 oz (113.807 kg). ECOG: 1 General appearance: alert Head: Normocephalic, without obvious abnormality, atraumatic Neck: no adenopathy, no carotid bruit, no JVD, supple, symmetrical, trachea midline and thyroid not enlarged, symmetric, no tenderness/mass/nodules Lymph nodes: Cervical, supraclavicular, and axillary nodes normal. Heart:regular rate and rhythm, S1, S2 normal, no murmur, click, rub or gallop Lung:chest  clear, no wheezing, rales, normal symmetric air entry Abdomin: soft, non-tender, without masses or organomegaly EXT:no erythema, induration, or nodules   Lab Results: Lab Results  Component Value Date   WBC 6.8 10/18/2013   HGB 14.9 10/18/2013   HCT 44.1 10/18/2013   MCV 95.2 10/18/2013   PLT 219 10/18/2013     Chemistry      Component Value Date/Time   NA 140 10/18/2013 0806   NA  138 10/21/2011 0818   K 4.3 10/18/2013 0806   K 4.4 10/21/2011 0818   CL 104 10/21/2011 0818   CO2 24 10/18/2013 0806   CO2 24 10/21/2011 0818   BUN 16.5 10/18/2013 0806   BUN 15 10/21/2011 0818   CREATININE 0.9 10/18/2013 0806   CREATININE 0.85 10/21/2011 0818      Component Value Date/Time   CALCIUM 9.4 10/18/2013 0806   CALCIUM 9.7 10/21/2011 0818   ALKPHOS 49 10/18/2013 0806   ALKPHOS 49 10/21/2011 0818   AST 18 10/18/2013 0806   AST 25 10/21/2011 0818   ALT 19 10/18/2013 0806   ALT 30 10/21/2011 0818   BILITOT 0.48 10/18/2013 0806   BILITOT 0.6 10/21/2011 0818      Impression and Plan:    A 63 year old with the following issues:  T1 N2 squamous cell carcinoma of the base of tongue, status post definitive radiation therapy with weekly cisplatin therapy concluded in July of 2008.  No evidence of any recurrent disease based on his exam and chest xray that was obtained on 10/18/2013. He is now approximately 7 years out from diagnosis with no evidence of disease recurrence. We will release Carlos Castaneda to his primary care physician's care. We will be available in the future should our expertise to be necessary.    patient reviewed with Dr. Alen Blew.    Awilda Metro E, PA-C  7/21/201510:07 AM

## 2019-05-20 ENCOUNTER — Ambulatory Visit: Payer: BC Managed Care – PPO

## 2019-05-29 ENCOUNTER — Ambulatory Visit: Payer: BC Managed Care – PPO

## 2021-07-22 ENCOUNTER — Other Ambulatory Visit: Payer: Self-pay | Admitting: Gastroenterology

## 2021-10-15 ENCOUNTER — Encounter (HOSPITAL_COMMUNITY): Payer: Self-pay | Admitting: Gastroenterology

## 2021-10-22 ENCOUNTER — Ambulatory Visit (HOSPITAL_BASED_OUTPATIENT_CLINIC_OR_DEPARTMENT_OTHER): Payer: Medicare PPO | Admitting: Certified Registered Nurse Anesthetist

## 2021-10-22 ENCOUNTER — Other Ambulatory Visit: Payer: Self-pay

## 2021-10-22 ENCOUNTER — Encounter (HOSPITAL_COMMUNITY)
Admission: RE | Disposition: A | Discharge status: Home / Self Care | Payer: Self-pay | Source: Ambulatory Visit | Attending: Gastroenterology

## 2021-10-22 ENCOUNTER — Ambulatory Visit (HOSPITAL_COMMUNITY)
Admission: RE | Admit: 2021-10-22 | Discharge: 2021-10-22 | Disposition: A | Discharge status: Home / Self Care | Payer: Medicare PPO | Source: Ambulatory Visit | Attending: Gastroenterology | Admitting: Gastroenterology

## 2021-10-22 ENCOUNTER — Ambulatory Visit (HOSPITAL_COMMUNITY): Payer: Medicare PPO | Admitting: Certified Registered Nurse Anesthetist

## 2021-10-22 ENCOUNTER — Encounter (HOSPITAL_COMMUNITY): Payer: Self-pay | Admitting: Gastroenterology

## 2021-10-22 DIAGNOSIS — K635 Polyp of colon: Secondary | ICD-10-CM

## 2021-10-22 DIAGNOSIS — Z79899 Other long term (current) drug therapy: Secondary | ICD-10-CM | POA: Insufficient documentation

## 2021-10-22 DIAGNOSIS — Z87891 Personal history of nicotine dependence: Secondary | ICD-10-CM | POA: Diagnosis not present

## 2021-10-22 DIAGNOSIS — Z8719 Personal history of other diseases of the digestive system: Secondary | ICD-10-CM | POA: Diagnosis not present

## 2021-10-22 DIAGNOSIS — Z86010 Personal history of colonic polyps: Secondary | ICD-10-CM | POA: Insufficient documentation

## 2021-10-22 DIAGNOSIS — I10 Essential (primary) hypertension: Secondary | ICD-10-CM | POA: Insufficient documentation

## 2021-10-22 DIAGNOSIS — K5909 Other constipation: Secondary | ICD-10-CM | POA: Insufficient documentation

## 2021-10-22 DIAGNOSIS — E119 Type 2 diabetes mellitus without complications: Secondary | ICD-10-CM | POA: Insufficient documentation

## 2021-10-22 DIAGNOSIS — D124 Benign neoplasm of descending colon: Secondary | ICD-10-CM | POA: Insufficient documentation

## 2021-10-22 DIAGNOSIS — D123 Benign neoplasm of transverse colon: Secondary | ICD-10-CM | POA: Diagnosis not present

## 2021-10-22 DIAGNOSIS — Z1211 Encounter for screening for malignant neoplasm of colon: Secondary | ICD-10-CM | POA: Insufficient documentation

## 2021-10-22 HISTORY — PX: POLYPECTOMY: SHX5525

## 2021-10-22 HISTORY — PX: COLONOSCOPY WITH PROPOFOL: SHX5780

## 2021-10-22 SURGERY — COLONOSCOPY WITH PROPOFOL
Anesthesia: Monitor Anesthesia Care

## 2021-10-22 MED ORDER — LACTATED RINGERS IV SOLN
INTRAVENOUS | Status: DC
  Administered 2021-10-22: 1000 mL via INTRAVENOUS

## 2021-10-22 MED ORDER — PHENYLEPHRINE 80 MCG/ML (10ML) SYRINGE FOR IV PUSH (FOR BLOOD PRESSURE SUPPORT)
PREFILLED_SYRINGE | INTRAVENOUS | Status: DC | PRN
  Administered 2021-10-22: 80 ug via INTRAVENOUS

## 2021-10-22 MED ORDER — SODIUM CHLORIDE 0.9 % IV SOLN: INTRAVENOUS | Status: DC

## 2021-10-22 MED ORDER — PROPOFOL 500 MG/50ML IV EMUL
INTRAVENOUS | Status: DC | PRN
  Administered 2021-10-22: 75 ug/kg/min via INTRAVENOUS

## 2021-10-22 MED ORDER — PROPOFOL 10 MG/ML IV BOLUS
INTRAVENOUS | Status: DC | PRN
  Administered 2021-10-22: 50 mg via INTRAVENOUS

## 2021-10-22 MED ORDER — ONDANSETRON HCL 4 MG/2ML IJ SOLN
INTRAMUSCULAR | Status: DC | PRN
  Administered 2021-10-22: 4 mg via INTRAVENOUS

## 2021-10-22 MED ORDER — PROPOFOL 1000 MG/100ML IV EMUL
INTRAVENOUS | Status: AC
  Filled 2021-10-22: qty 100

## 2021-10-22 SURGICAL SUPPLY — 22 items

## 2021-10-22 NOTE — H&P (Signed)
Carlos Castaneda HPI: This 71 year old white male presents to the office for colorectal cancer screening. He has problems with chronic constipation. He takes Linzess 290 mcg on a daily basis. He usually averages 1-2 BM's per week. He tends to strain frequently to facilitate BM's. He has a history of hemorrhoids. He has bright red blood in the stool. He has a good appetite and has gained 27 pounds over the last 5 years.  He denies having any complaints of abdominal pain, nausea, vomiting, acid reflux, dysphagia or odynophagia. He denies having a family history of colon cancer, celiac sprue or IBD. His last colonoscopy done on 09/14/2016 revealed internal hemorrhoids but was otherwise normal. He had a tubular adenoma removed in 2013.  Past Medical History:  Diagnosis Date   Allergy    rhinitis   Cancer ([place]) 07/2006   right base tongue,squamous cell,melanoma   Diabetes mellitus    type II   History of chemotherapy    cisplatin weekly during radt tx's,completed 10/2006   History of radiation therapy 09/27/06-11/08/06   rad tx tongue, lymph nodes, lower neck field   Hypercholesterolemia    Hypertension    Vertigo    hx    Past Surgical History:  Procedure Laterality Date   CERVICAL SPINE SURGERY      History reviewed. No pertinent family history.  Social History:  reports that he quit smoking about 15 years ago. His smoking use included cigarettes. He has a 25.00 pack-year smoking history. He does not have any smokeless tobacco history on file. He reports current alcohol use. No history on file for drug use.  Allergies: No Known Allergies  Medications: Scheduled: Continuous:  sodium chloride      No results found for this or any previous visit (from the past 24 hour(s)).   No results found.  ROS:  As stated above in the HPI otherwise negative.  Blood pressure (!) 183/106, pulse 89, temperature 98.3 F (36.8 C), temperature source Temporal, resp. rate 20, height 5' 7.5" (1.715 m),  weight 117.9 kg, SpO2 97 %.    PE: Gen: NAD, Alert and Oriented HEENT:  [place]/AT, EOMI Neck: Supple, no LAD Lungs: CTA Bilaterally CV: RRR without M/G/R ABD: Soft, NTND, +BS Ext: No C/C/E  Assessment/Plan: 1) Personal history of polyps - colonoscopy.  Carlos Castaneda D 10/22/2021, 9:41 AM

## 2021-10-22 NOTE — Op Note (Signed)
[facility] Patient Name: Carlos Castaneda Procedure Date: 10/22/2021 MRN: [id number] Attending MD: Carol Ada , MD Date of Birth: Jun 17, 1950 CSN: [id number] Age: 71 Admit Type: Outpatient Procedure:                Colonoscopy Indications:              High risk colon cancer surveillance: Personal                            history of colonic polyps Providers:                Carol Ada, MD, Carlyn Reichert, RN, Darliss Cheney,                            Technician Referring MD:              Medicines:                Propofol per Anesthesia Complications:            No immediate complications. Estimated Blood Loss:     Estimated blood loss: none. Procedure:                Pre-Anesthesia Assessment:                           - Prior to the procedure, a History and Physical                            was performed, and patient medications and                            allergies were reviewed. The patient's tolerance of                            previous anesthesia was also reviewed. The risks                            and benefits of the procedure and the sedation                            options and risks were discussed with the patient.                            All questions were answered, and informed consent                            was obtained. Prior Anticoagulants: The patient has                            taken no previous anticoagulant or antiplatelet                            agents. ASA Grade Assessment: III - A patient with                            severe systemic  disease. After reviewing the risks                            and benefits, the patient was deemed in                            satisfactory condition to undergo the procedure.                           - Sedation was administered by an anesthesia                            professional. Deep sedation was attained.                           After obtaining informed consent, the colonoscope                             was passed under direct vision. Throughout the                            procedure, the patient's blood pressure, pulse, and                            oxygen saturations were monitored continuously. The                            CF-HQ190L ([id number]) Olympus colonoscope was                            introduced through the anus and advanced to the the                            cecum, identified by appendiceal orifice and                            ileocecal valve. The colonoscopy was performed                            without difficulty. The patient tolerated the                            procedure well. The quality of the bowel                            preparation was evaluated using the BBPS [facility]                            Bowel Preparation Scale) with scores of: Right                            Colon = 3 (entire mucosa seen well with no residual  staining, small fragments of stool or opaque                            liquid), Transverse Colon = 3 (entire mucosa seen                            well with no residual staining, small fragments of                            stool or opaque liquid) and Left Colon = 3 (entire                            mucosa seen well with no residual staining, small                            fragments of stool or opaque liquid). The total                            BBPS score equals 9. The quality of the bowel                            preparation was good. The ileocecal valve,                            appendiceal orifice, and rectum were photographed. Scope In: 10:30:49 AM Scope Out: 10:51:20 AM Scope Withdrawal Time: 0 hours 14 minutes 34 seconds  Total Procedure Duration: 0 hours 20 minutes 31 seconds  Findings:      Seven sessile polyps were found in the descending colon and transverse       colon. The polyps were 3 to 10 mm in size. These polyps were removed       with a cold snare.  Resection and retrieval were complete. Impression:               - Seven 3 to 10 mm polyps in the descending colon                            and in the transverse colon, removed with a cold                            snare. Resected and retrieved. Moderate Sedation:      Not Applicable - Patient had care per Anesthesia. Recommendation:           - Patient has a contact number available for                            emergencies. The signs and symptoms of potential                            delayed complications were discussed with the                            patient. Return to normal activities tomorrow.  Written discharge instructions were provided to the                            patient.                           - Resume previous diet.                           - Continue present medications.                           - Await pathology results.                           - Repeat colonoscopy in 3 years for surveillance. Procedure Code(s):        --- Professional ---                           [phone], Colonoscopy, flexible; with removal of                            tumor(s), polyp(s), or other lesion(s) by snare                            technique Diagnosis Code(s):        --- Professional ---                           K63.5, Polyp of colon                           Z86.010, Personal history of colonic polyps CPT copyright 2019 [employer]. All rights reserved. The codes documented in this report are preliminary and upon coder review may  be revised to meet current compliance requirements. Carol Ada, MD Carol Ada, MD 10/22/2021 11:02:33 AM This report has been signed electronically. Number of Addenda: 0

## 2021-10-22 NOTE — Anesthesia Procedure Notes (Signed)
Procedure Name: MAC Date/Time: 10/22/2021 10:28 AM  Performed by: Deliah Boston, CRNAPre-anesthesia Checklist: Patient identified, Emergency Drugs available, Suction available and Patient being monitored Patient Re-evaluated:Patient Re-evaluated prior to induction Oxygen Delivery Method: Simple face mask Preoxygenation: Pre-oxygenation with 100% oxygen Placement Confirmation: positive ETCO2 and breath sounds checked- equal and bilateral

## 2021-10-22 NOTE — Discharge Instructions (Signed)

## 2021-10-22 NOTE — Anesthesia Postprocedure Evaluation (Signed)
Anesthesia Post Note  Patient: Carlos Castaneda  Procedure(s) Performed: COLONOSCOPY WITH PROPOFOL POLYPECTOMY     Patient location during evaluation: Endoscopy Anesthesia Type: MAC Level of consciousness: awake Pain management: pain level controlled Vital Signs Assessment: post-procedure vital signs reviewed and stable Respiratory status: spontaneous breathing Cardiovascular status: stable Postop Assessment: no apparent nausea or vomiting Anesthetic complications: no   No notable events documented.  Last Vitals:  Vitals:   10/22/21 1115 10/22/21 1120  BP: (!) 146/82 (!) 144/92  Pulse: 87 79  Resp: (!) 21 15  Temp:    SpO2: 93% 96%    Last Pain:  Vitals:   10/22/21 1120  TempSrc:   PainSc: 0-No pain                 Karmyn Lowman

## 2021-10-22 NOTE — Transfer of Care (Signed)
Immediate Anesthesia Transfer of Care Note  Patient: Carlos Castaneda  Procedure(s) Performed: Procedure(s): COLONOSCOPY WITH PROPOFOL (N/A) POLYPECTOMY  Patient Location: PACU  Anesthesia Type:MAC  Level of Consciousness: Patient easily awoken, sedated, comfortable, cooperative, following commands, responds to stimulation.   Airway & Oxygen Therapy: Patient spontaneously breathing, ventilating well, oxygen via simple oxygen mask.  Post-op Assessment: Report given to PACU RN, vital signs reviewed and stable, moving all extremities.   Post vital signs: Reviewed and stable.  Complications: No apparent anesthesia complications Last Vitals:  Vitals Value Taken Time  BP 128/77 10/22/21 1057  Temp    Pulse 81 10/22/21 1059  Resp 15 10/22/21 1059  SpO2 99 % 10/22/21 1059  Vitals shown include unvalidated device data.  Last Pain:  Vitals:   10/22/21 0932  TempSrc: Temporal  PainSc: 3          Complications: No notable events documented.

## 2021-10-22 NOTE — Anesthesia Preprocedure Evaluation (Signed)
Anesthesia Evaluation  Patient identified by MRN, date of birth, ID band Patient awake    Reviewed: Allergy & Precautions, NPO status , Patient's Chart, lab work & pertinent test results  Airway Mallampati: II  TM Distance: >3 FB     Dental   Pulmonary former smoker,    breath sounds clear to auscultation       Cardiovascular hypertension,  Rhythm:Regular Rate:Normal     Neuro/Psych    GI/Hepatic negative GI ROS, Neg liver ROS,   Endo/Other  diabetes  Renal/GU negative Renal ROS     Musculoskeletal   Abdominal   Peds  Hematology   Anesthesia Other Findings   Reproductive/Obstetrics                             Anesthesia Physical Anesthesia Plan  ASA: 3  Anesthesia Plan: MAC   Post-op Pain Management:    Induction: Intravenous  PONV Risk Score and Plan: Treatment may vary due to age or medical condition and Ondansetron  Airway Management Planned: Nasal Cannula and Simple Face Mask  Additional Equipment:   Intra-op Plan:   Post-operative Plan:   Informed Consent: I have reviewed the patients History and Physical, chart, labs and discussed the procedure including the risks, benefits and alternatives for the proposed anesthesia with the patient or authorized representative who has indicated his/her understanding and acceptance.     Dental advisory given  Plan Discussed with: CRNA and Anesthesiologist  Anesthesia Plan Comments:         Anesthesia Quick Evaluation

## 2021-10-24 ENCOUNTER — Encounter (HOSPITAL_COMMUNITY): Payer: Self-pay | Admitting: Gastroenterology

## 2021-10-25 LAB — SURGICAL PATHOLOGY

## 2022-03-30 ENCOUNTER — Telehealth: Payer: Self-pay

## 2022-03-30 NOTE — Telephone Encounter (Signed)
Call placed to Dr.Karen Madilyn Fireman  DDS [phone] office due to fax received asking for medical clearance for patient to have tooth extraction in January 2024. Patient has not been seen in office by Dr. Sondra Come in 10 years, per Dr. Sondra Come patient to follow up with PCP for clearance. Spoke with Melissa.
